# Patient Record
Sex: Male | Born: 1945 | Race: White | Hispanic: Yes | Marital: Married | Smoking: Former smoker
Health system: Southern US, Community
[De-identification: ages and names within clinical notes are randomized; demographics above are authoritative.]

## PROBLEM LIST (undated history)

## (undated) DIAGNOSIS — I2510 Atherosclerotic heart disease of native coronary artery without angina pectoris: Secondary | ICD-10-CM

## (undated) DIAGNOSIS — M069 Rheumatoid arthritis, unspecified: Secondary | ICD-10-CM

## (undated) DIAGNOSIS — Z87440 Personal history of urinary (tract) infections: Secondary | ICD-10-CM

## (undated) DIAGNOSIS — F32A Depression, unspecified: Secondary | ICD-10-CM

## (undated) DIAGNOSIS — N400 Enlarged prostate without lower urinary tract symptoms: Secondary | ICD-10-CM

## (undated) DIAGNOSIS — F4310 Post-traumatic stress disorder, unspecified: Secondary | ICD-10-CM

## (undated) DIAGNOSIS — I82409 Acute embolism and thrombosis of unspecified deep veins of unspecified lower extremity: Secondary | ICD-10-CM

## (undated) DIAGNOSIS — I10 Essential (primary) hypertension: Secondary | ICD-10-CM

## (undated) DIAGNOSIS — F329 Major depressive disorder, single episode, unspecified: Secondary | ICD-10-CM

## (undated) DIAGNOSIS — J45909 Unspecified asthma, uncomplicated: Secondary | ICD-10-CM

## (undated) DIAGNOSIS — E785 Hyperlipidemia, unspecified: Secondary | ICD-10-CM

## (undated) HISTORY — DX: Acute embolism and thrombosis of unspecified deep veins of unspecified lower extremity: I82.409

## (undated) HISTORY — PX: CORONARY STENT PLACEMENT: SHX1402

## (undated) HISTORY — DX: Post-traumatic stress disorder, unspecified: F43.10

## (undated) HISTORY — DX: Hyperlipidemia, unspecified: E78.5

## (undated) HISTORY — DX: Atherosclerotic heart disease of native coronary artery without angina pectoris: I25.10

## (undated) HISTORY — DX: Personal history of urinary (tract) infections: Z87.440

## (undated) HISTORY — DX: Benign prostatic hyperplasia without lower urinary tract symptoms: N40.0

---

## 2007-05-08 HISTORY — PX: CARDIAC SURGERY: SHX584

## 2008-01-08 ENCOUNTER — Encounter (HOSPITAL_COMMUNITY): Admission: RE | Admit: 2008-01-08 | Discharge: 2008-04-21 | Payer: Self-pay | Admitting: Cardiology

## 2012-04-21 ENCOUNTER — Emergency Department (HOSPITAL_COMMUNITY): Payer: Medicare Other

## 2012-04-21 ENCOUNTER — Encounter (HOSPITAL_COMMUNITY): Payer: Self-pay | Admitting: Emergency Medicine

## 2012-04-21 ENCOUNTER — Inpatient Hospital Stay (HOSPITAL_COMMUNITY)
Admission: EM | Admit: 2012-04-21 | Discharge: 2012-05-02 | DRG: 853 | Disposition: A | Discharge status: Skilled Nursing Facility | Payer: Medicare Other | Attending: Internal Medicine | Admitting: Internal Medicine

## 2012-04-21 DIAGNOSIS — N179 Acute kidney failure, unspecified: Secondary | ICD-10-CM | POA: Diagnosis not present

## 2012-04-21 DIAGNOSIS — G4700 Insomnia, unspecified: Secondary | ICD-10-CM | POA: Diagnosis not present

## 2012-04-21 DIAGNOSIS — N400 Enlarged prostate without lower urinary tract symptoms: Secondary | ICD-10-CM | POA: Diagnosis present

## 2012-04-21 DIAGNOSIS — A409 Streptococcal sepsis, unspecified: Principal | ICD-10-CM | POA: Diagnosis present

## 2012-04-21 DIAGNOSIS — R609 Edema, unspecified: Secondary | ICD-10-CM | POA: Diagnosis not present

## 2012-04-21 DIAGNOSIS — I824Y9 Acute embolism and thrombosis of unspecified deep veins of unspecified proximal lower extremity: Secondary | ICD-10-CM | POA: Diagnosis present

## 2012-04-21 DIAGNOSIS — I82409 Acute embolism and thrombosis of unspecified deep veins of unspecified lower extremity: Secondary | ICD-10-CM | POA: Diagnosis not present

## 2012-04-21 DIAGNOSIS — Z6841 Body Mass Index (BMI) 40.0 and over, adult: Secondary | ICD-10-CM | POA: Diagnosis not present

## 2012-04-21 DIAGNOSIS — A419 Sepsis, unspecified organism: Secondary | ICD-10-CM | POA: Diagnosis present

## 2012-04-21 DIAGNOSIS — IMO0002 Reserved for concepts with insufficient information to code with codable children: Secondary | ICD-10-CM | POA: Diagnosis not present

## 2012-04-21 DIAGNOSIS — Z7982 Long term (current) use of aspirin: Secondary | ICD-10-CM | POA: Diagnosis not present

## 2012-04-21 DIAGNOSIS — Z79899 Other long term (current) drug therapy: Secondary | ICD-10-CM | POA: Diagnosis not present

## 2012-04-21 DIAGNOSIS — E119 Type 2 diabetes mellitus without complications: Secondary | ICD-10-CM | POA: Diagnosis not present

## 2012-04-21 DIAGNOSIS — F329 Major depressive disorder, single episode, unspecified: Secondary | ICD-10-CM | POA: Diagnosis present

## 2012-04-21 DIAGNOSIS — J45909 Unspecified asthma, uncomplicated: Secondary | ICD-10-CM | POA: Diagnosis present

## 2012-04-21 DIAGNOSIS — Z9861 Coronary angioplasty status: Secondary | ICD-10-CM | POA: Diagnosis not present

## 2012-04-21 DIAGNOSIS — E871 Hypo-osmolality and hyponatremia: Secondary | ICD-10-CM | POA: Diagnosis present

## 2012-04-21 DIAGNOSIS — M009 Pyogenic arthritis, unspecified: Secondary | ICD-10-CM | POA: Diagnosis present

## 2012-04-21 DIAGNOSIS — Z452 Encounter for adjustment and management of vascular access device: Secondary | ICD-10-CM | POA: Diagnosis not present

## 2012-04-21 DIAGNOSIS — I10 Essential (primary) hypertension: Secondary | ICD-10-CM | POA: Diagnosis present

## 2012-04-21 DIAGNOSIS — N390 Urinary tract infection, site not specified: Secondary | ICD-10-CM | POA: Diagnosis present

## 2012-04-21 DIAGNOSIS — G9341 Metabolic encephalopathy: Secondary | ICD-10-CM | POA: Diagnosis not present

## 2012-04-21 DIAGNOSIS — M25569 Pain in unspecified knee: Secondary | ICD-10-CM | POA: Diagnosis not present

## 2012-04-21 DIAGNOSIS — M069 Rheumatoid arthritis, unspecified: Secondary | ICD-10-CM | POA: Diagnosis present

## 2012-04-21 DIAGNOSIS — R0602 Shortness of breath: Secondary | ICD-10-CM | POA: Diagnosis not present

## 2012-04-21 DIAGNOSIS — R5381 Other malaise: Secondary | ICD-10-CM | POA: Diagnosis not present

## 2012-04-21 DIAGNOSIS — F3289 Other specified depressive episodes: Secondary | ICD-10-CM | POA: Diagnosis present

## 2012-04-21 DIAGNOSIS — J811 Chronic pulmonary edema: Secondary | ICD-10-CM | POA: Diagnosis not present

## 2012-04-21 DIAGNOSIS — I339 Acute and subacute endocarditis, unspecified: Secondary | ICD-10-CM | POA: Diagnosis not present

## 2012-04-21 DIAGNOSIS — D72829 Elevated white blood cell count, unspecified: Secondary | ICD-10-CM | POA: Diagnosis not present

## 2012-04-21 DIAGNOSIS — F29 Unspecified psychosis not due to a substance or known physiological condition: Secondary | ICD-10-CM | POA: Diagnosis not present

## 2012-04-21 DIAGNOSIS — M79609 Pain in unspecified limb: Secondary | ICD-10-CM | POA: Diagnosis not present

## 2012-04-21 DIAGNOSIS — E785 Hyperlipidemia, unspecified: Secondary | ICD-10-CM | POA: Diagnosis not present

## 2012-04-21 DIAGNOSIS — M25469 Effusion, unspecified knee: Secondary | ICD-10-CM | POA: Diagnosis not present

## 2012-04-21 DIAGNOSIS — R6521 Severe sepsis with septic shock: Secondary | ICD-10-CM | POA: Diagnosis present

## 2012-04-21 DIAGNOSIS — Z5189 Encounter for other specified aftercare: Secondary | ICD-10-CM | POA: Diagnosis not present

## 2012-04-21 HISTORY — DX: Unspecified asthma, uncomplicated: J45.909

## 2012-04-21 HISTORY — DX: Essential (primary) hypertension: I10

## 2012-04-21 HISTORY — DX: Rheumatoid arthritis, unspecified: M06.9

## 2012-04-21 HISTORY — DX: Major depressive disorder, single episode, unspecified: F32.9

## 2012-04-21 HISTORY — DX: Depression, unspecified: F32.A

## 2012-04-21 LAB — URINALYSIS, ROUTINE W REFLEX MICROSCOPIC
Protein, ur: 100 mg/dL — AB
Urobilinogen, UA: 1 mg/dL (ref 0.0–1.0)

## 2012-04-21 LAB — BLOOD GAS, VENOUS
Drawn by: 295031
FIO2: 1 %
O2 Saturation: 82.3 %
Patient temperature: 98.6
pO2, Ven: 50 mmHg — ABNORMAL HIGH (ref 30.0–45.0)

## 2012-04-21 LAB — CBC
HCT: 37.8 % — ABNORMAL LOW (ref 39.0–52.0)
MCHC: 36 g/dL (ref 30.0–36.0)
MCV: 80.3 fL (ref 78.0–100.0)
RDW: 14.8 % (ref 11.5–15.5)
WBC: 4.9 10*3/uL (ref 4.0–10.5)

## 2012-04-21 LAB — COMPREHENSIVE METABOLIC PANEL
Albumin: 2.4 g/dL — ABNORMAL LOW (ref 3.5–5.2)
BUN: 39 mg/dL — ABNORMAL HIGH (ref 6–23)
Chloride: 90 mEq/L — ABNORMAL LOW (ref 96–112)
Creatinine, Ser: 2.84 mg/dL — ABNORMAL HIGH (ref 0.50–1.35)
GFR calc Af Amer: 25 mL/min — ABNORMAL LOW (ref >90)
Glucose, Bld: 313 mg/dL — ABNORMAL HIGH (ref 70–99)
Total Bilirubin: 0.6 mg/dL (ref 0.3–1.2)

## 2012-04-21 LAB — URINE MICROSCOPIC-ADD ON

## 2012-04-21 LAB — RAPID URINE DRUG SCREEN, HOSP PERFORMED
Amphetamines: NOT DETECTED
Barbiturates: NOT DETECTED
Tetrahydrocannabinol: NOT DETECTED

## 2012-04-21 LAB — LACTIC ACID, PLASMA: Lactic Acid, Venous: 3.1 mmol/L — ABNORMAL HIGH (ref 0.5–2.2)

## 2012-04-21 MED ORDER — SODIUM CHLORIDE 0.9 % IV BOLUS (SEPSIS)
1000.0000 mL | Freq: Once | INTRAVENOUS | Status: AC
  Administered 2012-04-21: 1000 mL via INTRAVENOUS

## 2012-04-21 MED ORDER — ACETAMINOPHEN 325 MG PO TABS
650.0000 mg | ORAL_TABLET | Freq: Once | ORAL | Status: AC
  Administered 2012-04-21: 650 mg via ORAL
  Filled 2012-04-21: qty 2

## 2012-04-21 MED ORDER — MORPHINE SULFATE 4 MG/ML IJ SOLN: 4.0000 mg | Freq: Once | INTRAMUSCULAR | Status: DC

## 2012-04-21 MED ORDER — DEXTROSE 5 % IV SOLN
1.0000 g | Freq: Once | INTRAVENOUS | Status: AC
  Administered 2012-04-21: 1 g via INTRAVENOUS
  Filled 2012-04-21: qty 10

## 2012-04-21 MED ORDER — LIDOCAINE HCL 2 % EX GEL
CUTANEOUS | Status: AC
  Administered 2012-04-21: 23:00:00
  Filled 2012-04-21: qty 10

## 2012-04-21 NOTE — ED Notes (Signed)
Dr Herma Carson called and updated on pt's condition.

## 2012-04-21 NOTE — ED Notes (Signed)
Patient refused rectal temperature MD Plunkett made aware.

## 2012-04-21 NOTE — ED Notes (Signed)
Central line placement per Dr. Anitra Lauth right IJ with no difficulty-tolerated procedure well-SPCXR completed after insertion-pt is awake and alert

## 2012-04-21 NOTE — ED Notes (Signed)
Patient refused rectal temp. EDP notified.

## 2012-04-21 NOTE — ED Notes (Signed)
Per spouse patient was in bed x5 days for gout knee pain. Upon arrival of EMS patient has difficulty breathing and foul smelling urine. Arrived to ER on CPAP and AMS.

## 2012-04-21 NOTE — ED Provider Notes (Addendum)
History     CSN: [id number]  Arrival date & time 04/21/12  1545   First MD Initiated Contact with Patient 04/21/12 1611      Chief Complaint  Patient presents with  . Altered Mental Status  . Shortness of Breath    (Consider location/radiation/quality/duration/timing/severity/associated sxs/prior treatment) HPI Comments: For the last one week patient has had a gout flare in his right knee. He has been taking oxycodone for the pain which per his wife he increased from 5 to 10 or 20 based on his pain level. Patient stopped taking his Lasix approximately 3 days ago per his PMD due to the severity of the gout. This a.m. when wife went to see the patient he was confused and talking to himself but still able to carry on a conversation with her. However he time he took pain medication was at 8 AM this morning.  She  denies any fever, cough or shortness of breath.was still able to carry on a conversation with her.   When EMS arrived and started moving him from the house to the truck is when he became short of breath.  EMS tried albuterol and Atrovent without improvement and then placed the patient on CPAP which seemed to irritate him more.  Patient is a 66 y.o. male presenting with altered mental status. The history is provided by the spouse and the EMS personnel. The history is limited by the condition of the patient.  Altered Mental Status This is a new problem. The current episode started 12 to 24 hours ago. The problem occurs constantly. The problem has been rapidly worsening. Associated symptoms include shortness of breath. Pertinent negatives include no chest pain and no abdominal pain. Nothing aggravates the symptoms. Nothing relieves the symptoms. He has tried nothing for the symptoms.    Past Medical History  Diagnosis Date  . Asthma   . Rheumatoid arthritis   . Hypertension   . Obesity     Past Surgical History  Procedure Date  . Coronary stent placement     No family history  on file.  History  Substance Use Topics  . Smoking status: Never Smoker   . Smokeless tobacco: Not on file  . Alcohol Use: No      Review of Systems  Constitutional: Negative for fever.  Respiratory: Positive for shortness of breath. Negative for cough and wheezing.   Cardiovascular: Negative for chest pain and leg swelling.  Gastrointestinal: Negative for abdominal pain.  Psychiatric/Behavioral: Positive for altered mental status.  All other systems reviewed and are negative.    Allergies  Review of patient's allergies indicates no known allergies.  Home Medications  No current outpatient prescriptions on file.  There were no vitals taken for this visit.  Physical Exam  Nursing note and vitals reviewed. Constitutional: He appears well-developed and well-nourished. He appears distressed.       Morbidly Obese male  HENT:  Head: Normocephalic and atraumatic.  Mouth/Throat: Oropharynx is clear and moist. Mucous membranes are dry.  Eyes: Conjunctivae normal and EOM are normal. Pupils are equal, round, and reactive to light.  Neck: Normal range of motion. Neck supple.  Cardiovascular: Regular rhythm and intact distal pulses.  Tachycardia present.   No murmur heard. Pulmonary/Chest: Tachypnea noted. No respiratory distress. He has decreased breath sounds. He has no wheezes. He has no rales.  Abdominal: Soft. He exhibits no distension. There is no tenderness. There is no rebound and no guarding.  Musculoskeletal: Normal range of motion. He  exhibits edema. He exhibits no tenderness.       Trace edema in lower ext.  No erythema or warmth to the right knee.  Difficult to access if swelling due to size of pt.  Neurological:       Opens his eyes to voice and can follow commands  Skin: Skin is warm and dry. No rash noted. He is not diaphoretic. No erythema.  Psychiatric: He has a normal mood and affect. His behavior is normal.    ED Course  Procedures (including critical care  time)  Labs Reviewed  CBC - Abnormal; Notable for the following:    HCT 37.8 (*)     All other components within normal limits  COMPREHENSIVE METABOLIC PANEL - Abnormal; Notable for the following:    Sodium 130 (*)     Chloride 90 (*)     Glucose, Bld 313 (*)     BUN 39 (*)     Creatinine, Ser 2.84 (*)     Albumin 2.4 (*)     AST 40 (*)     Alkaline Phosphatase 150 (*)     GFR calc non Af Amer 22 (*)     GFR calc Af Amer 25 (*)     All other components within normal limits  BLOOD GAS, VENOUS - Abnormal; Notable for the following:    pCO2, Ven 53.3 (*)     pO2, Ven 50.0 (*)     Bicarbonate 24.6 (*)     Acid-base deficit 2.3 (*)     All other components within normal limits  URINALYSIS, ROUTINE W REFLEX MICROSCOPIC - Abnormal; Notable for the following:    Color, Urine ORANGE (*)  BIOCHEMICALS MAY BE AFFECTED BY COLOR   APPearance TURBID (*)     Glucose, UA 100 (*)     Hgb urine dipstick LARGE (*)     Bilirubin Urine MODERATE (*)     Ketones, ur TRACE (*)     Protein, ur 100 (*)     Leukocytes, UA MODERATE (*)     All other components within normal limits  URINE MICROSCOPIC-ADD ON - Abnormal; Notable for the following:    Squamous Epithelial / LPF MANY (*)     Bacteria, UA MANY (*)     Casts HYALINE CASTS (*)     All other components within normal limits  LACTIC ACID, PLASMA - Abnormal; Notable for the following:    Lactic Acid, Venous 3.1 (*)     All other components within normal limits  TROPONIN I  URINE RAPID DRUG SCREEN (HOSP PERFORMED)  ACETAMINOPHEN LEVEL  URINE CULTURE   Dg Chest Port 1 View  04/21/2012  *RADIOLOGY REPORT*  Clinical Data: Shortness of breath.  Weakness.  PORTABLE CHEST - 1 VIEW  Comparison: None.  Findings: 1632 hours.  There is mild breathing artifact and detail is obscured by body habitus.  The heart is mildly enlarged.  There is vascular congestion without overt pulmonary edema.  No pleural effusion or confluent airspace opacity is present.   Telemetry leads overlie the chest.  IMPRESSION: Cardiomegaly with vascular congestion.  No overt pulmonary edema or confluent airspace opacity.   Original Report Authenticated By: Carey Bullocks, M.D.      Date: 04/21/2012  Rate: 115  Rhythm: sinus tachycardia  QRS Axis: left  Intervals: normal  ST/T Wave abnormalities: nonspecific ST/T changes  Conduction Disutrbances:none  Narrative Interpretation:   Old EKG Reviewed: unchanged  CENTRAL LINE Performed by: Gwyneth Sprout Consent: The  procedure was performed in an emergent situation. Required items: required blood products, implants, devices, and special equipment available Patient identity confirmed: arm band and provided demographic data Time out: Immediately prior to procedure a "time out" was called to verify the correct patient, procedure, equipment, support staff and site/side marked as required. Indications: vascular access Anesthesia: local infiltration Local anesthetic: lidocaine 1% with epinephrine Anesthetic total: 3 ml Patient sedated: no Preparation: skin prepped with 2% chlorhexidine Skin prep agent dried: skin prep agent completely dried prior to procedure Sterile barriers: all five maximum sterile barriers used - cap, mask, sterile gown, sterile gloves, and large sterile sheet Hand hygiene: hand hygiene performed prior to central venous catheter insertion  Location details: Right IJ   Catheter type: triple lumen Catheter size: 8 Fr Pre-procedure: landmarks identified Ultrasound guidance: Yes  Successful placement: yes Post-procedure: line sutured and dressing applied Assessment: blood return through all parts, free fluid flow, placement verified by x-ray and no pneumothorax on x-ray Patient tolerance: Patient tolerated the procedure well with no immediate complications.  CRITICAL CARE Performed by: Gwyneth Sprout   Total critical care time: 45  Critical care time was exclusive of separately billable  procedures and treating other patients.  Critical care was necessary to treat or prevent imminent or life-threatening deterioration.  Critical care was time spent personally by me on the following activities: development of treatment plan with patient and/or surrogate as well as nursing, discussions with consultants, evaluation of patient's response to treatment, examination of patient, obtaining history from patient or surrogate, ordering and performing treatments and interventions, ordering and review of laboratory studies, ordering and review of radiographic studies, pulse oximetry and re-evaluation of patient's condition.   No diagnosis found.    MDM   Patient with a significant history of arthritis and gout whose wife states approximately one week ago he developed gout in his right knee.  Patient has been taking oxycodone and this morning wife noticed him to be confused. She called EMS and when EMS arrived during transfer from the house to the ambulance patient develop shortness of breath and distress. They gave him albuterol and Atrovent without improvement and placed him on CPAP. When patient arrived he was fighting the CPAP machine and distress breathing over 30 times a minute. He does have occasional involuntary jerks. Patient will open his eyes when his name is called. Patient was taken off CPAP and placed on a nonrebreather where he became much more comfortable. Oxygenation sats are 100%. On switching CPAP to a nonrebreather patient's heart rate improved from 125-110. The patient has no signs of fluid overload the wife does state he stopped taking his Lasix on Friday due to taking the gout medication. Concern for new infection causing altered mental status versus medication overdose of narcotics versus hypercapnia as patient does have sleep apnea and has been taking narcotics. Last dose of narcotics was this morning at 8 AM. CBC, CMP, VBG, troponin, UA, acetaminophen level, UDS, CXR  pending. Unable to get an ABG some VBG done which showed some mild respiratory acidosis with a CO2 of 53 and a pH of 7.28. This could be the cause of his confusion. Oral temp 103 which is most likely the cause of his confusion.  Pt given tylenol.  Once C-pap discontinued and IV's started pt calmed down and was able to speak with his wife and appeared comfortable.   5:38 PM Chest x-ray unrevealing. CBC within normal limits. CMP showing a new acute renal insufficiency with a creatinine of  2.5. UA consistent with urinary tract infection. Patient started on Rocephin and a urine culture sent. On reevaluation he was breathing much more comfortably and able to talk in complete sentences. Will admit for hydration and treatment of urinary tract infection   7:58 PM Lactic returned at 3.1. Blood pressure has been marginal. We'll place a central line and put patient in the ICU the   Gwyneth Sprout, MD 04/21/12 1738  Gwyneth Sprout, MD 04/21/12 1958  Gwyneth Sprout, MD 04/21/12 2046

## 2012-04-21 NOTE — ED Notes (Signed)
Unsuccessfully attempted to obtain blood for labs x2.RN Silvio Pate made aware.Main lab phlebotomy will attempt

## 2012-04-21 NOTE — ED Notes (Signed)
Dr. Anitra Lauth at bedside. NRB on by RT.

## 2012-04-22 ENCOUNTER — Encounter (HOSPITAL_COMMUNITY): Payer: Self-pay | Admitting: Internal Medicine

## 2012-04-22 DIAGNOSIS — G9341 Metabolic encephalopathy: Secondary | ICD-10-CM

## 2012-04-22 DIAGNOSIS — N390 Urinary tract infection, site not specified: Secondary | ICD-10-CM | POA: Diagnosis present

## 2012-04-22 DIAGNOSIS — E871 Hypo-osmolality and hyponatremia: Secondary | ICD-10-CM

## 2012-04-22 DIAGNOSIS — N179 Acute kidney failure, unspecified: Secondary | ICD-10-CM

## 2012-04-22 DIAGNOSIS — A419 Sepsis, unspecified organism: Secondary | ICD-10-CM

## 2012-04-22 DIAGNOSIS — M25569 Pain in unspecified knee: Secondary | ICD-10-CM

## 2012-04-22 LAB — COMPREHENSIVE METABOLIC PANEL
ALT: 27 U/L (ref 0–53)
AST: 103 U/L — ABNORMAL HIGH (ref 0–37)
Albumin: 2 g/dL — ABNORMAL LOW (ref 3.5–5.2)
Alkaline Phosphatase: 124 U/L — ABNORMAL HIGH (ref 39–117)
Chloride: 98 mEq/L (ref 96–112)
Potassium: 3.9 mEq/L (ref 3.5–5.1)
Sodium: 134 mEq/L — ABNORMAL LOW (ref 135–145)
Total Bilirubin: 0.5 mg/dL (ref 0.3–1.2)
Total Protein: 5.9 g/dL — ABNORMAL LOW (ref 6.0–8.3)

## 2012-04-22 LAB — SYNOVIAL CELL COUNT + DIFF, W/ CRYSTALS
Eosinophils-Synovial: 0 % (ref 0–1)
Neutrophil, Synovial: 56 % — ABNORMAL HIGH (ref 0–25)

## 2012-04-22 LAB — GLUCOSE, CAPILLARY
Glucose-Capillary: 325 mg/dL — ABNORMAL HIGH (ref 70–99)
Glucose-Capillary: 286 mg/dL — ABNORMAL HIGH (ref 70–99)

## 2012-04-22 LAB — CBC
MCV: 88.1 fL (ref 78.0–100.0)
Platelets: 156 10*3/uL (ref 150–400)
RBC: 3.86 MIL/uL — ABNORMAL LOW (ref 4.22–5.81)
RDW: 13.8 % (ref 11.5–15.5)
WBC: 17.5 10*3/uL — ABNORMAL HIGH (ref 4.0–10.5)

## 2012-04-22 LAB — POCT I-STAT, CHEM 8
BUN: 44 mg/dL — ABNORMAL HIGH (ref 6–23)
Calcium, Ion: 1.08 mmol/L — ABNORMAL LOW (ref 1.13–1.30)
HCT: 34 % — ABNORMAL LOW (ref 39.0–52.0)
Sodium: 135 mEq/L (ref 135–145)
TCO2: 22 mmol/L (ref 0–100)

## 2012-04-22 LAB — CORTISOL: Cortisol, Plasma: 26.3 ug/dL

## 2012-04-22 LAB — TSH: TSH: 0.395 u[IU]/mL (ref 0.350–4.500)

## 2012-04-22 LAB — PROTIME-INR: Prothrombin Time: 16.6 seconds — ABNORMAL HIGH (ref 11.6–15.2)

## 2012-04-22 MED ORDER — SODIUM CHLORIDE 0.9 % IV SOLN
INTRAVENOUS | Status: AC
  Administered 2012-04-22: 100 mL/h via INTRAVENOUS
  Administered 2012-04-22: 14:00:00 via INTRAVENOUS

## 2012-04-22 MED ORDER — SODIUM CHLORIDE 0.9 % IJ SOLN
3.0000 mL | Freq: Two times a day (BID) | INTRAMUSCULAR | Status: DC
  Administered 2012-04-22 – 2012-05-02 (×10): 3 mL via INTRAVENOUS

## 2012-04-22 MED ORDER — OXYCODONE HCL 5 MG PO TABS
5.0000 mg | ORAL_TABLET | ORAL | Status: DC | PRN
  Administered 2012-04-22 – 2012-04-27 (×7): 10 mg via ORAL
  Filled 2012-04-22 (×7): qty 2

## 2012-04-22 MED ORDER — ONDANSETRON HCL 4 MG PO TABS
4.0000 mg | ORAL_TABLET | Freq: Four times a day (QID) | ORAL | Status: DC | PRN
  Administered 2012-04-28: 4 mg via ORAL
  Filled 2012-04-22: qty 1

## 2012-04-22 MED ORDER — SODIUM CHLORIDE 0.9 % IV BOLUS (SEPSIS)
1000.0000 mL | Freq: Once | INTRAVENOUS | Status: AC
  Administered 2012-04-22: 1000 mL via INTRAVENOUS

## 2012-04-22 MED ORDER — TRAZODONE HCL 150 MG PO TABS: 300.0000 mg | ORAL_TABLET | Freq: Every day | ORAL | Status: DC

## 2012-04-22 MED ORDER — HEPARIN SODIUM (PORCINE) 5000 UNIT/ML IJ SOLN
5000.0000 [IU] | Freq: Three times a day (TID) | INTRAMUSCULAR | Status: DC
  Administered 2012-04-22 – 2012-04-23 (×4): 5000 [IU] via SUBCUTANEOUS
  Filled 2012-04-22 (×7): qty 1

## 2012-04-22 MED ORDER — DEXTROSE 5 % IV SOLN
1.0000 g | INTRAVENOUS | Status: DC
  Administered 2012-04-22: 1 g via INTRAVENOUS
  Filled 2012-04-22: qty 10

## 2012-04-22 MED ORDER — NITROGLYCERIN 0.6 MG SL SUBL
0.6000 mg | SUBLINGUAL_TABLET | SUBLINGUAL | Status: DC | PRN
Start: 2012-04-22 — End: 2012-04-22

## 2012-04-22 MED ORDER — TRAZODONE HCL 150 MG PO TABS
300.0000 mg | ORAL_TABLET | Freq: Every day | ORAL | Status: DC
  Administered 2012-04-22 – 2012-05-01 (×10): 300 mg via ORAL
  Filled 2012-04-22 (×12): qty 2

## 2012-04-22 MED ORDER — NITROGLYCERIN 0.4 MG SL SUBL: 0.4000 mg | SUBLINGUAL_TABLET | SUBLINGUAL | Status: DC | PRN

## 2012-04-22 MED ORDER — ACETAMINOPHEN 650 MG RE SUPP: 650.0000 mg | Freq: Four times a day (QID) | RECTAL | Status: DC | PRN

## 2012-04-22 MED ORDER — SORBITOL 70 % SOLN
30.0000 mL | Freq: Every day | Status: DC | PRN
  Filled 2012-04-22: qty 30

## 2012-04-22 MED ORDER — ACETAMINOPHEN 325 MG PO TABS
650.0000 mg | ORAL_TABLET | Freq: Four times a day (QID) | ORAL | Status: DC | PRN
  Administered 2012-04-22 – 2012-04-24 (×2): 650 mg via ORAL
  Filled 2012-04-22 (×2): qty 2

## 2012-04-22 MED ORDER — HYDROCORTISONE SOD SUCCINATE 100 MG IJ SOLR
50.0000 mg | Freq: Three times a day (TID) | INTRAMUSCULAR | Status: AC
  Administered 2012-04-22 (×2): 50 mg via INTRAVENOUS
  Filled 2012-04-22 (×2): qty 1

## 2012-04-22 MED ORDER — MORPHINE SULFATE 2 MG/ML IJ SOLN
2.0000 mg | INTRAMUSCULAR | Status: DC | PRN
  Administered 2012-04-24 – 2012-05-01 (×24): 2 mg via INTRAVENOUS
  Administered 2012-05-02: 10:00:00 via INTRAVENOUS
  Filled 2012-04-22 (×26): qty 1

## 2012-04-22 MED ORDER — METOPROLOL TARTRATE 50 MG PO TABS
50.0000 mg | ORAL_TABLET | Freq: Every day | ORAL | Status: DC
  Administered 2012-04-22 – 2012-05-02 (×11): 50 mg via ORAL
  Filled 2012-04-22 (×11): qty 1

## 2012-04-22 MED ORDER — INSULIN ASPART 100 UNIT/ML ~~LOC~~ SOLN
0.0000 [IU] | Freq: Three times a day (TID) | SUBCUTANEOUS | Status: DC
  Administered 2012-04-22: 8 [IU] via SUBCUTANEOUS
  Administered 2012-04-22: 5 [IU] via SUBCUTANEOUS
  Administered 2012-04-22: 11 [IU] via SUBCUTANEOUS
  Administered 2012-04-23: 5 [IU] via SUBCUTANEOUS
  Administered 2012-04-23: 8 [IU] via SUBCUTANEOUS
  Administered 2012-04-23: 5 [IU] via SUBCUTANEOUS

## 2012-04-22 MED ORDER — ONDANSETRON HCL 4 MG/2ML IJ SOLN
4.0000 mg | Freq: Four times a day (QID) | INTRAMUSCULAR | Status: DC | PRN
  Administered 2012-04-24 – 2012-04-30 (×4): 4 mg via INTRAVENOUS
  Filled 2012-04-22 (×4): qty 2

## 2012-04-22 MED ORDER — LIDOCAINE HCL (PF) 1 % IJ SOLN
INTRAMUSCULAR | Status: AC
  Administered 2012-04-22: 23:00:00
  Filled 2012-04-22: qty 5

## 2012-04-22 NOTE — Progress Notes (Signed)
Transfer note: Pt transferred from [facility] tonight with septic shock likely from UTI. His BP is coming up nicely. NP to bedside. Pt says he is feeling better already. He has no specific complaints at this time.

## 2012-04-22 NOTE — Progress Notes (Signed)
Placed patient on our unit mode of auto titrate 10 min with a 3 liter bleed in.  because patient did not know his home settings.  Patient brought his home unit, but forgot to bring power cord.

## 2012-04-22 NOTE — Progress Notes (Signed)
Inpatient Diabetes Program Recommendations  AACE/ADA: New Consensus Statement on Inpatient Glycemic Control (2013)  Target Ranges:  Prepandial:   less than 140 mg/dL      Peak postprandial:   less than 180 mg/dL (1-2 hours)      Critically ill patients:  140 - 180 mg/dL   Reason for Visit: Results for BRYANT, SAYE (MRN [id number]) as of 04/22/2012 09:20  Ref. Range 04/21/2012 16:10 04/22/2012 00:48 04/22/2012 05:25  Glucose Latest Range: 70-99 mg/dL 409 (H) 811 (H) 914 (H)   No history of diabetes noted. Patient is on Solucortef 50 mg IV q 8 hours.  A1C pending.  Consider adding Lantus 20 units daily.  May also consider q 4 hour Novolog correction.  Will follow.

## 2012-04-22 NOTE — Progress Notes (Signed)
I have seen and examined pt admitted this am by Dr Robb Matar with worsening knee pain/OA, AMS, fevers, and hypotensive and found to have a UTI. He was started on empiric abx, stress dose steroids and transferred to [employer]). On exam he is awake and orientedx3, still some confusion per family at bedside. R. Knee with ballotable effusion, edematous, tender, no erythema. Knee x-ray showed efusion moderate effusion and tricompartmental degen. Changes. Per wife he has been followed by ortho at [facility] and needs knee replacement which has not been done because of his obesity. BPs are stable at this time with current management. Will continue current regimen as per Dr Robb Matar, follow cultures, cortisol level and further treat accordingly. Will also obtain uric acid level. I have called DR chandler for possible knee aspiration- awaiting call back.  Roanna Epley [facility] [phone]

## 2012-04-22 NOTE — Consult Note (Signed)
Reason for Consult:Right knee pain  Referring Physician: Dr. Coralie Carpen Lizak is an 66 y.o. male.  HPI:  Pt complains for right throbbing knee pain. States he has a history of right knee pain due to arthritis but it had gotten much worse in the last week and has been getting worse during his admission. He denies recent trauma or injury. Describes right knee as throbbing and aching. More painful with touch and movement. Per pt, history of gout and DM.    Past Medical History  Diagnosis Date  . Asthma   . Rheumatoid arthritis   . Hypertension   . Depression     Past Surgical History  Procedure Date  . Coronary stent placement     Family History  Problem Relation Age of Onset  . Hypertension Mother   . Other Father     Social History:  reports that he has never smoked. He has never used smokeless tobacco. He reports that he does not drink alcohol or use illicit drugs.  Allergies: No Known Allergies  Medications: I have reviewed the patient's current medications.  Results for orders placed during the hospital encounter of 04/21/12 (from the past 48 hour(s))  BLOOD GAS, VENOUS     Status: Abnormal   Collection Time   04/21/12  4:00 PM      Component Value Range Comment   FIO2 1.00      Delivery systems NRB MASK      pH, Ven 7.286  7.250 - 7.300    pCO2, Ven 53.3 (*) 45.0 - 50.0 mmHg    pO2, Ven 50.0 (*) 30.0 - 45.0 mmHg    Bicarbonate 24.6 (*) 20.0 - 24.0 mEq/L    TCO2 22.3  0 - 100 mmol/L    Acid-base deficit 2.3 (*) 0.0 - 2.0 mmol/L    O2 Saturation 82.3      Patient temperature 98.6      Collection site VEIN      Drawn by [phone]      Sample type VEIN     CBC     Status: Abnormal   Collection Time   04/21/12  4:10 PM      Component Value Range Comment   WBC 4.9  4.0 - 10.5 K/uL    RBC 4.71  4.22 - 5.81 MIL/uL    Hemoglobin 13.6  13.0 - 17.0 g/dL    HCT 98.1 (*) 19.1 - 52.0 %    MCV 80.3  78.0 - 100.0 fL    MCH 28.9  26.0 - 34.0 pg    MCHC 36.0  30.0 -  36.0 g/dL    RDW 47.8  29.5 - 62.1 %    Platelets 162  150 - 400 K/uL   COMPREHENSIVE METABOLIC PANEL     Status: Abnormal   Collection Time   04/21/12  4:10 PM      Component Value Range Comment   Sodium 130 (*) 135 - 145 mEq/L    Potassium 4.0  3.5 - 5.1 mEq/L    Chloride 90 (*) 96 - 112 mEq/L    CO2 23  19 - 32 mEq/L    Glucose, Bld 313 (*) 70 - 99 mg/dL    BUN 39 (*) 6 - 23 mg/dL    Creatinine, Ser 3.08 (*) 0.50 - 1.35 mg/dL    Calcium 9.0  8.4 - 65.7 mg/dL    Total Protein 7.0  6.0 - 8.3 g/dL    Albumin 2.4 (*) 3.5 -  5.2 g/dL    AST 40 (*) 0 - 37 U/L    ALT 14  0 - 53 U/L    Alkaline Phosphatase 150 (*) 39 - 117 U/L    Total Bilirubin 0.6  0.3 - 1.2 mg/dL    GFR calc non Af Amer 22 (*) >90 mL/min    GFR calc Af Amer 25 (*) >90 mL/min   TROPONIN I     Status: Normal   Collection Time   04/21/12  4:10 PM      Component Value Range Comment   Troponin I <0.30  <0.30 ng/mL   ACETAMINOPHEN LEVEL     Status: Normal   Collection Time   04/21/12  4:10 PM      Component Value Range Comment   Acetaminophen (Tylenol), Serum <15.0  10 - 30 ug/mL   URINALYSIS, ROUTINE W REFLEX MICROSCOPIC     Status: Abnormal   Collection Time   04/21/12  4:46 PM      Component Value Range Comment   Color, Urine ORANGE (*) YELLOW BIOCHEMICALS MAY BE AFFECTED BY COLOR   APPearance TURBID (*) CLEAR    Specific Gravity, Urine 1.026  1.005 - 1.030    pH 5.0  5.0 - 8.0    Glucose, UA 100 (*) NEGATIVE mg/dL    Hgb urine dipstick LARGE (*) NEGATIVE    Bilirubin Urine MODERATE (*) NEGATIVE    Ketones, ur TRACE (*) NEGATIVE mg/dL    Protein, ur 161 (*) NEGATIVE mg/dL    Urobilinogen, UA 1.0  0.0 - 1.0 mg/dL    Nitrite NEGATIVE  NEGATIVE    Leukocytes, UA MODERATE (*) NEGATIVE   URINE RAPID DRUG SCREEN (HOSP PERFORMED)     Status: Normal   Collection Time   04/21/12  4:46 PM      Component Value Range Comment   Opiates NONE DETECTED  NONE DETECTED    Cocaine NONE DETECTED  NONE DETECTED     Benzodiazepines NONE DETECTED  NONE DETECTED    Amphetamines NONE DETECTED  NONE DETECTED    Tetrahydrocannabinol NONE DETECTED  NONE DETECTED    Barbiturates NONE DETECTED  NONE DETECTED   URINE MICROSCOPIC-ADD ON     Status: Abnormal   Collection Time   04/21/12  4:46 PM      Component Value Range Comment   Squamous Epithelial / LPF MANY (*) RARE    WBC, UA 11-20  <3 WBC/hpf    RBC / HPF 11-20  <3 RBC/hpf    Bacteria, UA MANY (*) RARE    Casts HYALINE CASTS (*) NEGATIVE   LACTIC ACID, PLASMA     Status: Abnormal   Collection Time   04/21/12  7:13 PM      Component Value Range Comment   Lactic Acid, Venous 3.1 (*) 0.5 - 2.2 mmol/L   LACTIC ACID, PLASMA     Status: Abnormal   Collection Time   04/21/12 10:40 PM      Component Value Range Comment   Lactic Acid, Venous 3.4 (*) 0.5 - 2.2 mmol/L   CORTISOL     Status: Normal   Collection Time   04/21/12 10:40 PM      Component Value Range Comment   Cortisol, Plasma 74.5     POCT I-STAT, CHEM 8     Status: Abnormal   Collection Time   04/22/12 12:48 AM      Component Value Range Comment   Sodium 135  135 - 145 mEq/L  Potassium 4.0  3.5 - 5.1 mEq/L    Chloride 102  96 - 112 mEq/L    BUN 44 (*) 6 - 23 mg/dL    Creatinine, Ser 4.09 (*) 0.50 - 1.35 mg/dL    Glucose, Bld 811 (*) 70 - 99 mg/dL    Calcium, Ion 9.14 (*) 1.13 - 1.30 mmol/L    TCO2 22  0 - 100 mmol/L    Hemoglobin 11.6 (*) 13.0 - 17.0 g/dL    HCT 78.2 (*) 95.6 - 52.0 %   LACTIC ACID, PLASMA     Status: Normal   Collection Time   04/22/12  5:00 AM      Component Value Range Comment   Lactic Acid, Venous 1.9  0.5 - 2.2 mmol/L   SODIUM, URINE, RANDOM     Status: Normal   Collection Time   04/22/12  5:10 AM      Component Value Range Comment   Sodium, Ur 12     CREATININE, URINE, RANDOM     Status: Normal   Collection Time   04/22/12  5:10 AM      Component Value Range Comment   Creatinine, Urine 211.53     CBC     Status: Abnormal   Collection Time    04/22/12  5:25 AM      Component Value Range Comment   WBC 17.5 (*) 4.0 - 10.5 K/uL    RBC 3.86 (*) 4.22 - 5.81 MIL/uL    Hemoglobin 11.7 (*) 13.0 - 17.0 g/dL    HCT 21.3 (*) 08.6 - 52.0 %    MCV 88.1  78.0 - 100.0 fL    MCH 30.3  26.0 - 34.0 pg    MCHC 34.4  30.0 - 36.0 g/dL    RDW 57.8  46.9 - 62.9 %    Platelets 156  150 - 400 K/uL   TSH     Status: Normal   Collection Time   04/22/12  5:25 AM      Component Value Range Comment   TSH 0.395  0.350 - 4.500 uIU/mL   COMPREHENSIVE METABOLIC PANEL     Status: Abnormal   Collection Time   04/22/12  5:25 AM      Component Value Range Comment   Sodium 134 (*) 135 - 145 mEq/L    Potassium 3.9  3.5 - 5.1 mEq/L    Chloride 98  96 - 112 mEq/L    CO2 21  19 - 32 mEq/L    Glucose, Bld 334 (*) 70 - 99 mg/dL    BUN 48 (*) 6 - 23 mg/dL    Creatinine, Ser 5.28 (*) 0.50 - 1.35 mg/dL    Calcium 8.1 (*) 8.4 - 10.5 mg/dL    Total Protein 5.9 (*) 6.0 - 8.3 g/dL    Albumin 2.0 (*) 3.5 - 5.2 g/dL    AST 413 (*) 0 - 37 U/L    ALT 27  0 - 53 U/L    Alkaline Phosphatase 124 (*) 39 - 117 U/L    Total Bilirubin 0.5  0.3 - 1.2 mg/dL    GFR calc non Af Amer 18 (*) >90 mL/min    GFR calc Af Amer 21 (*) >90 mL/min   PROTIME-INR     Status: Abnormal   Collection Time   04/22/12  5:25 AM      Component Value Range Comment   Prothrombin Time 16.6 (*) 11.6 - 15.2 seconds    INR 1.38  0.00 - 1.49   CORTISOL     Status: Normal   Collection Time   04/22/12  5:25 AM      Component Value Range Comment   Cortisol, Plasma 26.3     HEMOGLOBIN A1C     Status: Abnormal   Collection Time   04/22/12  5:25 AM      Component Value Range Comment   Hemoglobin A1C 6.5 (*) <5.7 %    Mean Plasma Glucose 140 (*) <117 mg/dL   GLUCOSE, CAPILLARY     Status: Abnormal   Collection Time   04/22/12  7:41 AM      Component Value Range Comment   Glucose-Capillary 325 (*) 70 - 99 mg/dL    Comment 1 Notify RN     MRSA PCR SCREENING     Status: Normal   Collection Time    04/22/12  8:03 AM      Component Value Range Comment   MRSA by PCR NEGATIVE  NEGATIVE   GLUCOSE, CAPILLARY     Status: Abnormal   Collection Time   04/22/12 11:32 AM      Component Value Range Comment   Glucose-Capillary 254 (*) 70 - 99 mg/dL    Comment 1 Notify RN     GLUCOSE, CAPILLARY     Status: Abnormal   Collection Time   04/22/12  4:51 PM      Component Value Range Comment   Glucose-Capillary 227 (*) 70 - 99 mg/dL    Comment 1 Notify RN     GLUCOSE, CAPILLARY     Status: Abnormal   Collection Time   04/22/12  9:41 PM      Component Value Range Comment   Glucose-Capillary 286 (*) 70 - 99 mg/dL     Dg Chest Port 1 View  04/21/2012  *RADIOLOGY REPORT*  Clinical Data: Central line placement.  PORTABLE CHEST - 1 VIEW  Comparison: Earlier today.  Findings: Interval right jugular catheter with its tip overlying the right lung apex.  Breathing motion blurring.  Stable enlarged cardiac silhouette and grossly stable prominent pulmonary vasculature.  A small amount of patchy opacity is again demonstrated at the left lateral lung base.  Thoracic spine degenerative changes.  IMPRESSION:  1.  Right jugular catheter tip in the region of the right innominate vein. 2.  No pneumothorax. 3.  Stable cardiomegaly and pulmonary vascular congestion. 4.  Stable small amount of patchy atelectasis or pneumonia at the left lateral lung base.   Original Report Authenticated By: Beckie Salts, M.D.    Dg Chest Port 1 View  04/21/2012  *RADIOLOGY REPORT*  Clinical Data: Shortness of breath.  Weakness.  PORTABLE CHEST - 1 VIEW  Comparison: None.  Findings: 1632 hours.  There is mild breathing artifact and detail is obscured by body habitus.  The heart is mildly enlarged.  There is vascular congestion without overt pulmonary edema.  No pleural effusion or confluent airspace opacity is present.  Telemetry leads overlie the chest.  IMPRESSION: Cardiomegaly with vascular congestion.  No overt pulmonary edema or  confluent airspace opacity.   Original Report Authenticated By: Carey Bullocks, M.D.    Dg Knee Complete 4 Views Right  04/21/2012  *RADIOLOGY REPORT*  Clinical Data: Right knee pain and swelling.  RIGHT KNEE - COMPLETE 4+ VIEW  Comparison: None.  Findings: Severe narrowing of all three joint spaces.  This is most pronounced involving the medial joint space, with a bone on bone appearance.  Moderate to marked  spur formation involving all three joint compartments.  Moderate sized effusion.  No visible intra- articular loose bodies. Diffuse soft tissue edema.  IMPRESSION: Severe tricompartmental degenerative changes with a moderate sized effusion.   Original Report Authenticated By: Beckie Salts, M.D.     ROS Blood pressure 169/79, pulse 96, temperature 99 F (37.2 C), temperature source Oral, resp. rate 22, height 5' 8.5" (1.74 m), weight 157.2 kg (346 lb 9 oz), SpO2 97.00%. Physical Exam Awake, alert, unable to determine orientated to past history Right knee with lrg effusion, mild warmth and erythema Diffusely tender to palpation  Distally neurovascularly intact Compartments soft   Assessment/Plan: Right knee pain, possible gout with history but concern for infection NPO now Will follow stat aspirate, if necessary will take to OR   Procedure: Right knee was draped. The area was correctly identified and marked. Right knee was steriley prepped with alcohol swabs. 5ml of 1% lidocaine was injected into the right knee at the superiolateral portal site. The needle was removed. The site was again steriley prepped with Betadyne. A 18 G needle was then inserted into the superiolateral portal site and 80cc of purulent-appearing aspirate was removed. A bandage was applied. The patient tolerated the procedure well.   Jiles Harold 04/22/2012, 10:51 PM

## 2012-04-22 NOTE — Progress Notes (Signed)
Utilization Review Completed.Ahsha Hinsley T12/17/2013   

## 2012-04-22 NOTE — H&P (Signed)
[facility] History and Physical  Anthony Strickland JYN:[id number] DOB: 1946/03/23 DOA: 04/21/2012  Referring physician: Dr. Anitra Lauth PCP: No primary provider on file.  Specialists: none  Chief Complaint: metabolic encephalopathy   HPI: Anthony Strickland is a 66 y.o. male  With past medical history of hypertension, BPH asthma and osteoarthritis that comes in for confusion on the day of admission. As per patient and wife they relate he's been having fevers for the last 2 days. The patient has not been able to drink or eat anything except for his medications for the last 2 days. He relates his knee has been bothering him for the past week and has limited his ability to get up from bed for the last 5 days. This morning his wife relates he was so confused cannot dial a phone number. He denies any burning when he urinates, any nausea vomiting or diarrhea. His last bowel movement was 4 days ago.  Review of Systems: The patient denies anorexia, weight loss, vision loss, decreased hearing, hoarseness, chest pain, syncope, dyspnea on exertion, peripheral edema, balance deficits, hemoptysis, abdominal pain, melena, hematochezia, severe indigestion/heartburn, hematuria, incontinence, genital sores, muscle weakness, suspicious skin lesions, transient blindness, difficulty walking, depression, unusual weight change, abnormal bleeding, enlarged lymph nodes, angioedema, and breast masses.    Past Medical History  Diagnosis Date  . Asthma   . Rheumatoid arthritis   . Hypertension   . Obesity    Past Surgical History  Procedure Date  . Coronary stent placement    Social History:  reports that he has never smoked. He has never used smokeless tobacco. He reports that he does not drink alcohol or use illicit drugs. Visit home with his wife can perform most of his ADLs  No Known Allergies  Family History  Problem Relation Age of Onset  . Hypertension Mother   . Other Father     Prior to Admission  medications   Medication Sig Start Date End Date Taking? Authorizing Provider  aspirin 325 MG EC tablet Take 325 mg by mouth daily.   Yes Historical Provider, MD  benazepril (LOTENSIN) 10 MG tablet Take 10 mg by mouth daily.   Yes Historical Provider, MD  gabapentin (NEURONTIN) 100 MG capsule Take 200-400 mg by mouth 2 (two) times daily. Takes 200mg  in the morning and takes 400mg  in the evening   Yes Historical Provider, MD  metoprolol (LOPRESSOR) 50 MG tablet Take 50 mg by mouth daily.   Yes Historical Provider, MD  nitroGLYCERIN (NITROSTAT) 0.6 MG SL tablet Place 0.6 mg under the tongue every 5 (five) minutes as needed.   Yes Historical Provider, MD  oxyCODONE (OXY IR/ROXICODONE) 5 MG immediate release tablet Take 5-10 mg by mouth every 4 (four) hours as needed.   Yes Historical Provider, MD  pravastatin (PRAVACHOL) 20 MG tablet Take 20 mg by mouth daily.   Yes Historical Provider, MD  terazosin (HYTRIN) 2 MG capsule Take 2 mg by mouth at bedtime.   Yes Historical Provider, MD  trazodone (DESYREL) 300 MG tablet Take 300 mg by mouth at bedtime.   Yes Historical Provider, MD   Physical Exam: Filed Vitals:   04/21/12 2300 04/21/12 2330 04/21/12 2350 04/22/12 0010  BP: 121/62 112/62 103/68 94/53  Pulse: 94 93 91 109  Temp:  99.1 F (37.3 C) 99.3 F (37.4 C) 99.3 F (37.4 C)  TempSrc:      Resp: 21 21 19 22   SpO2: 100% 100% 99% 99%     General:  Awake alert in no acute distress nontoxic appearing, morbidly obese male  Eyes: Anicteric no pallor  ENT: Dry mucous membrane  Neck: No appreciated JVD  Cardiovascular: Rate and rhythm with positive S1 and S2 no murmurs rubs or gallops  Respiratory: Good air movement and clear to auscultation  Abdomen: Positive bowel sounds nontender nondistended and soft  Skin: No rashes or ulcerations  Musculoskeletal: Hi right knee is warm, not erythematous exquisitely tender to touch and movement  Psychiatric: Appropriate  Neurologic: Awake  alert and oriented x4 coherent for language and nonfocal  Labs on Admission:  Basic Metabolic Panel:  Lab 04/21/12 9604  NA 130*  K 4.0  CL 90*  CO2 23  GLUCOSE 313*  BUN 39*  CREATININE 2.84*  CALCIUM 9.0  MG --  PHOS --   Liver Function Tests:  Lab 04/21/12 1610  AST 40*  ALT 14  ALKPHOS 150*  BILITOT 0.6  PROT 7.0  ALBUMIN 2.4*   No results found for this basename: LIPASE:5,AMYLASE:5 in the last 168 hours No results found for this basename: AMMONIA:5 in the last 168 hours CBC:  Lab 04/21/12 1610  WBC 4.9  NEUTROABS --  HGB 13.6  HCT 37.8*  MCV 80.3  PLT 162   Cardiac Enzymes:  Lab 04/21/12 1610  CKTOTAL --  CKMB --  CKMBINDEX --  TROPONINI <0.30    BNP (last 3 results) No results found for this basename: PROBNP:3 in the last 8760 hours CBG: No results found for this basename: GLUCAP:5 in the last 168 hours  Radiological Exams on Admission: Dg Chest Port 1 View  04/21/2012  *RADIOLOGY REPORT*  Clinical Data: Central line placement.  PORTABLE CHEST - 1 VIEW  Comparison: Earlier today.  Findings: Interval right jugular catheter with its tip overlying the right lung apex.  Breathing motion blurring.  Stable enlarged cardiac silhouette and grossly stable prominent pulmonary vasculature.  A small amount of patchy opacity is again demonstrated at the left lateral lung base.  Thoracic spine degenerative changes.  IMPRESSION:  1.  Right jugular catheter tip in the region of the right innominate vein. 2.  No pneumothorax. 3.  Stable cardiomegaly and pulmonary vascular congestion. 4.  Stable small amount of patchy atelectasis or pneumonia at the left lateral lung base.   Original Report Authenticated By: Beckie Salts, M.D.    Dg Chest Port 1 View  04/21/2012  *RADIOLOGY REPORT*  Clinical Data: Shortness of breath.  Weakness.  PORTABLE CHEST - 1 VIEW  Comparison: None.  Findings: 1632 hours.  There is mild breathing artifact and detail is obscured by body habitus.   The heart is mildly enlarged.  There is vascular congestion without overt pulmonary edema.  No pleural effusion or confluent airspace opacity is present.  Telemetry leads overlie the chest.  IMPRESSION: Cardiomegaly with vascular congestion.  No overt pulmonary edema or confluent airspace opacity.   Original Report Authenticated By: Carey Bullocks, M.D.    Dg Knee Complete 4 Views Right  04/21/2012  *RADIOLOGY REPORT*  Clinical Data: Right knee pain and swelling.  RIGHT KNEE - COMPLETE 4+ VIEW  Comparison: None.  Findings: Severe narrowing of all three joint spaces.  This is most pronounced involving the medial joint space, with a bone on bone appearance.  Moderate to marked spur formation involving all three joint compartments.  Moderate sized effusion.  No visible intra- articular loose bodies. Diffuse soft tissue edema.  IMPRESSION: Severe tricompartmental degenerative changes with a moderate sized effusion.  Original Report Authenticated By: Beckie Salts, M.D.     EKG: Sinus tachycardia with left axis deviation and nonspecific T wave changes  Assessment/Plan Principal Problem: Septic shock: - Significant improvement in his blood pressure after 4 L of IV fluids. I will go ahead and given him an additional liter of normal saline, then 125 cc an hour, 24 hours. A Foley catheter was placed and the patient has only had 100 cc of urine. Admit him to the step down unit. Check CVP every 12 hours. We'll try to keep CVP closer to 10.   - This most likely secondary to urinary tract infection. He has already gotten one dose of Rocephin. His tachycardia has resolved, his blood pressure continues to improve. On admission his lactic acid was 3.4. Check a cortisone level, we'll start him on Solu-Cortef every 8 hours for 2 doses and continue if no improvement in his blood pressure or cortisone level low.  - We'll have to continue to monitor vitals  closely, if he decompensates we'll have to consult CC in for  further management. He already has a central line.   UTI (lower urinary tract infection): - Check urine cultures, his UA was compatible coronary tract infection. We'll continue Rocephin and the escalate treatment and sensitivities are back.  Acute kidney injury: - This most likely secondary to septic shock and NSAIDs use. Overhead DC NSAIDs. Continue IV fluid, which urinary sodium and urinary creatinine. Monitor strict I.'s and nose. Check a basic metabolic panel in the morning.   Metabolic encephalopathy: - Multifactorial most likely secondary to sepsis and narcotics. The patient is more awake and alert after IV fluids  Hyponatremia: - Is mostly secondary to decreased intravascular volume continue IV fluids monitor sugars and those.  Knee pain: -This most likely secondary to osteoarthritis versus gout. He's had knee pain for about a week unable to walk for the last 5 days. Any excess was only tender to palpation. He said he saw his primary care Dr. about 2 weeks ago this knee pain but over the last week has progressively gotten worse. And knee x-ray show moderate effusion with tricompartmental syndrome.  Code Status: full Family Communication: wife at bedside Disposition Plan: none (indicate anticipated LOS)  Time spent: 60 minutes  Marinda Elk [facility] Pager [phone]  If 7PM-7AM, please contact night-coverage [email or web address] Password [facility] 04/22/2012, 12:25 AM

## 2012-04-22 NOTE — Progress Notes (Signed)
Maren Reamer NP made aware of BS 334 and increase in BUN & CREAT.New orders obtained.

## 2012-04-23 ENCOUNTER — Encounter (HOSPITAL_COMMUNITY)
Admission: EM | Disposition: A | Discharge status: Home / Self Care | Payer: Self-pay | Source: Home / Self Care | Attending: Internal Medicine

## 2012-04-23 ENCOUNTER — Inpatient Hospital Stay (HOSPITAL_COMMUNITY): Payer: Medicare Other | Admitting: Anesthesiology

## 2012-04-23 ENCOUNTER — Encounter (HOSPITAL_COMMUNITY): Payer: Self-pay | Admitting: Anesthesiology

## 2012-04-23 DIAGNOSIS — M009 Pyogenic arthritis, unspecified: Secondary | ICD-10-CM

## 2012-04-23 DIAGNOSIS — M79609 Pain in unspecified limb: Secondary | ICD-10-CM

## 2012-04-23 HISTORY — PX: KNEE ARTHROSCOPY: SHX127

## 2012-04-23 LAB — GLUCOSE, CAPILLARY
Glucose-Capillary: 236 mg/dL — ABNORMAL HIGH (ref 70–99)
Glucose-Capillary: 247 mg/dL — ABNORMAL HIGH (ref 70–99)
Glucose-Capillary: 233 mg/dL — ABNORMAL HIGH (ref 70–99)

## 2012-04-23 LAB — BASIC METABOLIC PANEL
BUN: 48 mg/dL — ABNORMAL HIGH (ref 6–23)
CO2: 23 mEq/L (ref 19–32)
Calcium: 8.6 mg/dL (ref 8.4–10.5)
Chloride: 100 mEq/L (ref 96–112)
Chloride: 99 mEq/L (ref 96–112)
Creatinine, Ser: 1.82 mg/dL — ABNORMAL HIGH (ref 0.50–1.35)
Creatinine, Ser: 1.71 mg/dL — ABNORMAL HIGH (ref 0.50–1.35)
GFR calc Af Amer: 47 mL/min — ABNORMAL LOW (ref >90)
GFR calc non Af Amer: 40 mL/min — ABNORMAL LOW (ref >90)
Glucose, Bld: 279 mg/dL — ABNORMAL HIGH (ref 70–99)

## 2012-04-23 LAB — CBC WITH DIFFERENTIAL/PLATELET
Basophils Absolute: 0 10*3/uL (ref 0.0–0.1)
Eosinophils Absolute: 0 10*3/uL (ref 0.0–0.7)
HCT: 34.8 % — ABNORMAL LOW (ref 39.0–52.0)
Lymphs Abs: 1.4 10*3/uL (ref 0.7–4.0)
MCH: 30.7 pg (ref 26.0–34.0)
MCHC: 34.8 g/dL (ref 30.0–36.0)
MCV: 88.3 fL (ref 78.0–100.0)
Monocytes Relative: 11 % (ref 3–12)
Neutro Abs: 14.2 10*3/uL — ABNORMAL HIGH (ref 1.7–7.7)
RDW: 14.2 % (ref 11.5–15.5)

## 2012-04-23 LAB — URINE CULTURE: Culture: NO GROWTH

## 2012-04-23 LAB — PATHOLOGIST SMEAR REVIEW: Path Review: INCREASED

## 2012-04-23 LAB — CBC
Platelets: 166 10*3/uL (ref 150–400)
RDW: 14.2 % (ref 11.5–15.5)
WBC: 18 10*3/uL — ABNORMAL HIGH (ref 4.0–10.5)

## 2012-04-23 LAB — SURGICAL PCR SCREEN
MRSA, PCR: NEGATIVE
Staphylococcus aureus: NEGATIVE

## 2012-04-23 SURGERY — ARTHROSCOPY, KNEE
Anesthesia: General | Site: Knee | Laterality: Right | Wound class: Dirty or Infected

## 2012-04-23 MED ORDER — OXYCODONE HCL 5 MG/5ML PO SOLN: 5.0000 mg | Freq: Once | ORAL | Status: DC | PRN

## 2012-04-23 MED ORDER — ENOXAPARIN SODIUM 150 MG/ML ~~LOC~~ SOLN
1.0000 mg/kg | SUBCUTANEOUS | Status: AC
  Filled 2012-04-23: qty 2

## 2012-04-23 MED ORDER — MIDAZOLAM HCL 5 MG/5ML IJ SOLN
INTRAMUSCULAR | Status: DC | PRN
  Administered 2012-04-23: .5 mg via INTRAVENOUS

## 2012-04-23 MED ORDER — TOBRAMYCIN SULFATE 80 MG/2ML IJ SOLN
80.0000 mg | Freq: Once | INTRAVENOUS | Status: DC
  Filled 2012-04-23: qty 2

## 2012-04-23 MED ORDER — PIPERACILLIN-TAZOBACTAM 3.375 G IVPB
3.3750 g | Freq: Three times a day (TID) | INTRAVENOUS | Status: DC
  Administered 2012-04-23 – 2012-04-25 (×7): 3.375 g via INTRAVENOUS
  Filled 2012-04-23 (×8): qty 50

## 2012-04-23 MED ORDER — INSULIN ASPART 100 UNIT/ML ~~LOC~~ SOLN
SUBCUTANEOUS | Status: AC
  Filled 2012-04-23: qty 1

## 2012-04-23 MED ORDER — LIDOCAINE HCL (CARDIAC) 20 MG/ML IV SOLN
INTRAVENOUS | Status: DC | PRN
  Administered 2012-04-23: 100 mg via INTRAVENOUS

## 2012-04-23 MED ORDER — HYDROMORPHONE HCL PF 1 MG/ML IJ SOLN
0.2500 mg | INTRAMUSCULAR | Status: DC | PRN
  Administered 2012-04-23 (×2): 0.5 mg via INTRAVENOUS

## 2012-04-23 MED ORDER — SENNOSIDES-DOCUSATE SODIUM 8.6-50 MG PO TABS
2.0000 | ORAL_TABLET | Freq: Every day | ORAL | Status: DC
  Administered 2012-04-24 – 2012-05-01 (×7): 2 via ORAL
  Filled 2012-04-23 (×10): qty 2

## 2012-04-23 MED ORDER — ALBUTEROL SULFATE HFA 108 (90 BASE) MCG/ACT IN AERS
INHALATION_SPRAY | RESPIRATORY_TRACT | Status: DC | PRN
  Administered 2012-04-23: 2 via RESPIRATORY_TRACT

## 2012-04-23 MED ORDER — OXYCODONE HCL 5 MG PO TABS: 5.0000 mg | ORAL_TABLET | Freq: Once | ORAL | Status: DC | PRN

## 2012-04-23 MED ORDER — PROPOFOL 10 MG/ML IV BOLUS
INTRAVENOUS | Status: DC | PRN
  Administered 2012-04-23: 75 mg via INTRAVENOUS
  Administered 2012-04-23: 375 mg via INTRAVENOUS

## 2012-04-23 MED ORDER — FENTANYL CITRATE 0.05 MG/ML IJ SOLN: 50.0000 ug | Freq: Once | INTRAMUSCULAR | Status: DC

## 2012-04-23 MED ORDER — SODIUM CHLORIDE 0.9 % IV SOLN
INTRAVENOUS | Status: DC | PRN
  Administered 2012-04-23: 15:00:00 via INTRAVENOUS

## 2012-04-23 MED ORDER — VANCOMYCIN HCL 10 G IV SOLR
2000.0000 mg | INTRAVENOUS | Status: DC
  Administered 2012-04-23 – 2012-04-25 (×3): 2000 mg via INTRAVENOUS
  Filled 2012-04-23 (×3): qty 2000

## 2012-04-23 MED ORDER — PROMETHAZINE HCL 25 MG/ML IJ SOLN
6.2500 mg | INTRAMUSCULAR | Status: DC | PRN
  Filled 2012-04-23: qty 1

## 2012-04-23 MED ORDER — FENTANYL CITRATE 0.05 MG/ML IJ SOLN
INTRAMUSCULAR | Status: DC | PRN
  Administered 2012-04-23: 150 ug via INTRAVENOUS

## 2012-04-23 MED ORDER — ENOXAPARIN SODIUM 150 MG/ML ~~LOC~~ SOLN
1.0000 mg/kg | Freq: Two times a day (BID) | SUBCUTANEOUS | Status: DC
  Administered 2012-04-24: 160 mg via SUBCUTANEOUS
  Filled 2012-04-23 (×3): qty 2

## 2012-04-23 MED ORDER — ENOXAPARIN SODIUM 150 MG/ML ~~LOC~~ SOLN
1.0000 mg/kg | Freq: Once | SUBCUTANEOUS | Status: AC
  Administered 2012-04-24: 160 mg via SUBCUTANEOUS
  Filled 2012-04-23: qty 2

## 2012-04-23 MED ORDER — MIDAZOLAM HCL 2 MG/2ML IJ SOLN: 1.0000 mg | INTRAMUSCULAR | Status: DC | PRN

## 2012-04-23 MED ORDER — HYDROMORPHONE HCL PF 1 MG/ML IJ SOLN
INTRAMUSCULAR | Status: AC
  Filled 2012-04-23: qty 1

## 2012-04-23 MED ORDER — POLYETHYLENE GLYCOL 3350 17 G PO PACK
17.0000 g | PACK | Freq: Every day | ORAL | Status: DC | PRN
  Administered 2012-04-24: 17 g via ORAL
  Filled 2012-04-23 (×3): qty 1

## 2012-04-23 MED ORDER — SODIUM CHLORIDE 0.9 % IR SOLN
Status: DC | PRN
  Administered 2012-04-23 (×3): 3000 mL

## 2012-04-23 SURGICAL SUPPLY — 53 items
BANDAGE GAUZE ELAST BULKY 4 IN (GAUZE/BANDAGES/DRESSINGS) ×4 IMPLANT
BLADE CUDA 5.5 (BLADE) ×2 IMPLANT
BLADE GREAT WHITE 4.2 (BLADE) IMPLANT
BNDG COHESIVE 6X5 TAN STRL LF (GAUZE/BANDAGES/DRESSINGS) IMPLANT
BNDG ELASTIC 6X10 VLCR STRL LF (GAUZE/BANDAGES/DRESSINGS) ×2 IMPLANT
BUR OVAL 6.0 (BURR) IMPLANT
CHLORAPREP W/TINT 26ML (MISCELLANEOUS) ×2 IMPLANT
CLOTH BEACON ORANGE TIMEOUT ST (SAFETY) ×2 IMPLANT
COVER SURGICAL LIGHT HANDLE (MISCELLANEOUS) ×2 IMPLANT
CUFF TOURNIQUET SINGLE 34IN LL (TOURNIQUET CUFF) IMPLANT
CUFF TOURNIQUET SINGLE 44IN (TOURNIQUET CUFF) ×2 IMPLANT
DRAPE ARTHROSCOPY W/POUCH 114 (DRAPES) ×2 IMPLANT
DRAPE U-SHAPE 47X51 STRL (DRAPES) ×2 IMPLANT
DRSG EMULSION OIL 3X3 NADH (GAUZE/BANDAGES/DRESSINGS) ×2 IMPLANT
DRSG PAD ABDOMINAL 8X10 ST (GAUZE/BANDAGES/DRESSINGS) ×2 IMPLANT
EVACUATOR 3/16  PVC DRAIN (DRAIN) ×1
EVACUATOR 3/16 PVC DRAIN (DRAIN) ×1 IMPLANT
GLOVE BIO SURGEON STRL SZ 6.5 (GLOVE) ×2 IMPLANT
GLOVE BIO SURGEON STRL SZ7.5 (GLOVE) ×2 IMPLANT
GLOVE BIO SURGEON STRL SZ8.5 (GLOVE) ×2 IMPLANT
GLOVE BIOGEL PI IND STRL 7.0 (GLOVE) ×1 IMPLANT
GLOVE BIOGEL PI IND STRL 7.5 (GLOVE) ×1 IMPLANT
GLOVE BIOGEL PI IND STRL 8 (GLOVE) ×2 IMPLANT
GLOVE BIOGEL PI INDICATOR 7.0 (GLOVE) ×1
GLOVE BIOGEL PI INDICATOR 7.5 (GLOVE) ×1
GLOVE BIOGEL PI INDICATOR 8 (GLOVE) ×2
GLOVE ORTHO TXT STRL SZ7.5 (GLOVE) ×2 IMPLANT
GLOVE SURG ORTHO 7.0 STRL STRW (GLOVE) ×2 IMPLANT
GLOVE SURG SS PI 8.0 STRL IVOR (GLOVE) ×2 IMPLANT
GOWN PREVENTION PLUS LG XLONG (DISPOSABLE) ×2 IMPLANT
GOWN PREVENTION PLUS XLARGE (GOWN DISPOSABLE) IMPLANT
GOWN SRG XL XLNG 56XLVL 4 (GOWN DISPOSABLE) ×1 IMPLANT
GOWN STRL NON-REIN LRG LVL3 (GOWN DISPOSABLE) ×2 IMPLANT
GOWN STRL NON-REIN XL XLG LVL4 (GOWN DISPOSABLE) ×1
GOWN STRL REIN 2XL XLG LVL4 (GOWN DISPOSABLE) ×2 IMPLANT
HOVERMATT SINGLE USE (MISCELLANEOUS) ×2 IMPLANT
KIT BASIN OR (CUSTOM PROCEDURE TRAY) ×2 IMPLANT
KIT ROOM TURNOVER OR (KITS) ×2 IMPLANT
MANIFOLD NEPTUNE II (INSTRUMENTS) ×2 IMPLANT
NEEDLE 18GX1X1/2 (RX/OR ONLY) (NEEDLE) IMPLANT
PACK ARTHROSCOPY DSU (CUSTOM PROCEDURE TRAY) IMPLANT
PAD ARMBOARD 7.5X6 YLW CONV (MISCELLANEOUS) ×4 IMPLANT
PADDING CAST COTTON 6X4 STRL (CAST SUPPLIES) IMPLANT
SPONGE GAUZE 4X4 12PLY (GAUZE/BANDAGES/DRESSINGS) ×2 IMPLANT
SPONGE LAP 4X18 X RAY DECT (DISPOSABLE) ×2 IMPLANT
SUT ETHILON 2 0 FS 18 (SUTURE) ×2 IMPLANT
SUT ETHILON 4 0 PS 2 18 (SUTURE) ×2 IMPLANT
SYR 20CC LL (SYRINGE) IMPLANT
TOWEL OR 17X24 6PK STRL BLUE (TOWEL DISPOSABLE) ×4 IMPLANT
TUBE CONNECTING 12X1/4 (SUCTIONS) ×2 IMPLANT
TUBING ARTHROSCOPY IRRIG 16FT (MISCELLANEOUS) ×2 IMPLANT
WAND 90 DEG TURBOVAC W/CORD (SURGICAL WAND) IMPLANT
WATER STERILE IRR 1000ML POUR (IV SOLUTION) ×2 IMPLANT

## 2012-04-23 NOTE — Progress Notes (Addendum)
[facility]             Progress Note   Subjective: C/o severe  Pain in R knee  Objective: Vital signs in last 24 hours: Temp:  [97.4 F (36.3 C)-101.3 F (38.5 C)] 99.1 F (37.3 C) (12/18 0340) Pulse Rate:  [82-104] 92  (12/18 0340) Resp:  [12-22] 12  (12/18 0340) BP: (130-172)/(68-80) 138/71 mmHg (12/18 0500) SpO2:  [97 %-99 %] 97 % (12/18 0340) Weight:  [159.7 kg (352 lb 1.2 oz)] 159.7 kg (352 lb 1.2 oz) (12/17 2323) Weight change: 2.5 kg (5 lb 8.2 oz) Last BM Date: 04/18/12  Intake/Output from previous day: 12/17 0701 - 12/18 0700 In: 1930 [P.O.:460; I.V.:870; IV Piggyback:50] Out: 1200 [Urine:1200]     Physical Exam: General: Alert, awake, oriented x3, in no acute distress. HEENT: No bruits, no goiter. Heart: Regular rate and rhythm, without murmurs, rubs, gallops. Lungs: Clear to auscultation bilaterally. Abdomen: Soft, nontender, nondistended, positive bowel sounds. Extremities: R knee with swelling, tender with ROM, RLE edema 2 plus with positive pedal pulses. Neuro: Grossly intact, nonfocal.    Lab Results: Basic Metabolic Panel:  Basename 04/23/12 0500 04/22/12 0525  NA 135 134*  K 3.6 3.9  CL 100 98  CO2 23 21  GLUCOSE 279* 334*  BUN 48* 48*  CREATININE 1.82* 3.36*  CALCIUM 8.7 8.1*  MG -- --  PHOS -- --   Liver Function Tests:  [facility] 04/22/12 0525 04/21/12 1610  AST 103* 40*  ALT 27 14  ALKPHOS 124* 150*  BILITOT 0.5 0.6  PROT 5.9* 7.0  ALBUMIN 2.0* 2.4*   No results found for this basename: LIPASE:2,AMYLASE:2 in the last 72 hours No results found for this basename: AMMONIA:2 in the last 72 hours CBC:  Basename 04/23/12 0500 04/22/12 0525  WBC 17.5* 17.5*  NEUTROABS 14.2* --  HGB 12.1* 11.7*  HCT 34.8* 34.0*  MCV 88.3 88.1  PLT 152 156   Cardiac Enzymes:  Basename 04/21/12 1610  CKTOTAL --  CKMB --  CKMBINDEX --  TROPONINI <0.30   BNP: No results found for this basename: PROBNP:3 in the last 72  hours D-Dimer: No results found for this basename: DDIMER:2 in the last 72 hours CBG:  Basename 04/22/12 2141 04/22/12 1651 04/22/12 1132 04/22/12 0741  GLUCAP 286* 227* 254* 325*   Hemoglobin A1C:  Basename 04/22/12 0525  HGBA1C 6.5*   Fasting Lipid Panel: No results found for this basename: CHOL,HDL,LDLCALC,TRIG,CHOLHDL,LDLDIRECT in the last 72 hours Thyroid Function Tests:  Basename 04/22/12 0525  TSH 0.395  T4TOTAL --  FREET4 --  T3FREE --  THYROIDAB --   Anemia Panel: No results found for this basename: VITAMINB12,FOLATE,FERRITIN,TIBC,IRON,RETICCTPCT in the last 72 hours Coagulation:  Basename 04/22/12 0525  LABPROT 16.6*  INR 1.38   Urine Drug Screen: Drugs of Abuse     Component Value Date/Time   LABOPIA NONE DETECTED 04/21/2012 1646   COCAINSCRNUR NONE DETECTED 04/21/2012 1646   LABBENZ NONE DETECTED 04/21/2012 1646   AMPHETMU NONE DETECTED 04/21/2012 1646   THCU NONE DETECTED 04/21/2012 1646   LABBARB NONE DETECTED 04/21/2012 1646    Alcohol Level: No results found for this basename: ETH:2 in the last 72 hours Urinalysis:  Basename 04/21/12 1646  COLORURINE ORANGE*  LABSPEC 1.026  PHURINE 5.0  GLUCOSEU 100*  HGBUR LARGE*  BILIRUBINUR MODERATE*  KETONESUR TRACE*  PROTEINUR 100*  UROBILINOGEN 1.0  NITRITE NEGATIVE  LEUKOCYTESUR MODERATE*   Recent Results (from the past 240 hour(s))  URINE CULTURE  Status: Normal (Preliminary result)   Collection Time   04/21/12  4:46 PM      Component Value Range Status Comment   Specimen Description URINE, CLEAN CATCH   Final    Special Requests NONE   Final    Culture  Setup Time 04/21/2012 22:45   Final    Colony Count PENDING   Incomplete    Culture Culture reincubated for better growth   Final    Report Status PENDING   Incomplete   MRSA PCR SCREENING     Status: Normal   Collection Time   04/22/12  8:03 AM      Component Value Range Status Comment   MRSA by PCR NEGATIVE  NEGATIVE Final      Studies/Results: Dg Chest Port 1 View  04/21/2012  *RADIOLOGY REPORT*  Clinical Data: Central line placement.  PORTABLE CHEST - 1 VIEW  Comparison: Earlier today.  Findings: Interval right jugular catheter with its tip overlying the right lung apex.  Breathing motion blurring.  Stable enlarged cardiac silhouette and grossly stable prominent pulmonary vasculature.  A small amount of patchy opacity is again demonstrated at the left lateral lung base.  Thoracic spine degenerative changes.  IMPRESSION:  1.  Right jugular catheter tip in the region of the right innominate vein. 2.  No pneumothorax. 3.  Stable cardiomegaly and pulmonary vascular congestion. 4.  Stable small amount of patchy atelectasis or pneumonia at the left lateral lung base.   Original Report Authenticated By: Beckie Salts, M.D.    Dg Chest Port 1 View  04/21/2012  *RADIOLOGY REPORT*  Clinical Data: Shortness of breath.  Weakness.  PORTABLE CHEST - 1 VIEW  Comparison: None.  Findings: 1632 hours.  There is mild breathing artifact and detail is obscured by body habitus.  The heart is mildly enlarged.  There is vascular congestion without overt pulmonary edema.  No pleural effusion or confluent airspace opacity is present.  Telemetry leads overlie the chest.  IMPRESSION: Cardiomegaly with vascular congestion.  No overt pulmonary edema or confluent airspace opacity.   Original Report Authenticated By: Carey Bullocks, M.D.    Dg Knee Complete 4 Views Right  04/21/2012  *RADIOLOGY REPORT*  Clinical Data: Right knee pain and swelling.  RIGHT KNEE - COMPLETE 4+ VIEW  Comparison: None.  Findings: Severe narrowing of all three joint spaces.  This is most pronounced involving the medial joint space, with a bone on bone appearance.  Moderate to marked spur formation involving all three joint compartments.  Moderate sized effusion.  No visible intra- articular loose bodies. Diffuse soft tissue edema.  IMPRESSION: Severe tricompartmental  degenerative changes with a moderate sized effusion.   Original Report Authenticated By: Beckie Salts, M.D.     Medications: Scheduled Meds:   . cefTRIAXone (ROCEPHIN)  IV  1 g Intravenous Q24H  . heparin  5,000 Units Subcutaneous Q8H  . insulin aspart  0-15 Units Subcutaneous TID WC  . metoprolol  50 mg Oral Daily  . sodium chloride  3 mL Intravenous Q12H  . traZODone  300 mg Oral QHS   Continuous Infusions:  PRN Meds:.acetaminophen, acetaminophen, morphine injection, nitroGLYCERIN, ondansetron (ZOFRAN) IV, ondansetron, oxyCODONE, sorbitol  Assessment/Plan: Septic shock: resolved, BP stable for >36hours now, continue gentle IVF-  R knee septic arthritis: s/p knee aspiration yesterday, WBC 200K, change abx, add Vancomycin and Zosyn, FU synovial fluid cultures, Joint washout per Ortho  UTI (lower urinary tract infection):  - urine cultures negative and re-incubated  Acute kidney  injury:  - This most likely secondary to septic shock and NSAIDs use. Improving, continue IVF today, bmet in am .  Metabolic encephalopathy:  - Multifactorial most likely secondary to sepsis: resolved   Hyponatremia:  - Is mostly secondary to decreased intravascular volume , resolved  R leg swelling: r/o DVT  Code Status: full  Family Communication: discussed with patient at bedside  Disposition Plan: inpatient    Time spent coordinating care:   LOS: 2 days   [facility] Pager: [phone] 04/23/2012, 7:35 AM

## 2012-04-23 NOTE — Progress Notes (Signed)
Surgical pcr sent 

## 2012-04-23 NOTE — Progress Notes (Signed)
Dr. Jomarie Longs paged regarding DVT to rt. Common femoral, superficial femoral, and popliteal vein. Returned page and given orders for Lovenox per pharmacy for  DVT.

## 2012-04-23 NOTE — Progress Notes (Signed)
ANTIBIOTIC CONSULT NOTE - INITIAL  Pharmacy Consult for vanc/zosyn Indication: Sepsis/septic knee  No Known Allergies  Patient Measurements: Height: 5' 8.5" (174 cm) Weight: 352 lb 1.2 oz (159.7 kg) IBW/kg (Calculated) : 69.55   Vital Signs: Temp: 98.4 F (36.9 C) (12/18 0738) Temp src: Oral (12/18 0738) BP: 124/56 mmHg (12/18 0738) Pulse Rate: 91  (12/18 0738) Intake/Output from previous day: 12/17 0701 - 12/18 0700 In: 1930 [P.O.:460; I.V.:870; IV Piggyback:50] Out: 1200 [Urine:1200] Intake/Output from this shift:    Labs:  Basename 04/23/12 0500 04/22/12 0525 04/22/12 0510 04/22/12 0048 04/21/12 1610  WBC 17.5* 17.5* -- -- 4.9  HGB 12.1* 11.7* -- 11.6* --  PLT 152 156 -- -- 162  LABCREA -- -- 211.53 -- --  CREATININE 1.82* 3.36* -- 2.80* --   Estimated Creatinine Clearance: 60.4 ml/min (by C-G formula based on Cr of 1.82). No results found for this basename: VANCOTROUGH:2,VANCOPEAK:2,VANCORANDOM:2,GENTTROUGH:2,GENTPEAK:2,GENTRANDOM:2,TOBRATROUGH:2,TOBRAPEAK:2,TOBRARND:2,AMIKACINPEAK:2,AMIKACINTROU:2,AMIKACIN:2, in the last 72 hours   Microbiology: Recent Results (from the past 720 hour(s))  URINE CULTURE     Status: Normal (Preliminary result)   Collection Time   04/21/12  4:46 PM      Component Value Range Status Comment   Specimen Description URINE, CLEAN CATCH   Final    Special Requests NONE   Final    Culture  Setup Time 04/21/2012 22:45   Final    Colony Count PENDING   Incomplete    Culture Culture reincubated for better growth   Final    Report Status PENDING   Incomplete   MRSA PCR SCREENING     Status: Normal   Collection Time   04/22/12  8:03 AM      Component Value Range Status Comment   MRSA by PCR NEGATIVE  NEGATIVE Final     Medical History: Past Medical History  Diagnosis Date  . Asthma   . Rheumatoid arthritis   . Hypertension   . Depression     Medications:  Scheduled:    . heparin  5,000 Units Subcutaneous Q8H  . [COMPLETED]  hydrocortisone sod succinate (SOLU-CORTEF) injection  50 mg Intravenous Q8H  . insulin aspart  0-15 Units Subcutaneous TID WC  . [COMPLETED] lidocaine      . metoprolol  50 mg Oral Daily  . piperacillin-tazobactam (ZOSYN)  IV  3.375 g Intravenous Q8H  . sodium chloride  3 mL Intravenous Q12H  . traZODone  300 mg Oral QHS  . vancomycin  2,000 mg Intravenous Q24H  . [DISCONTINUED] cefTRIAXone (ROCEPHIN)  IV  1 g Intravenous Q24H   Assessment: 66 yo morbidly obese male who was admitted last night for confusion that thought to be relating to his UTI. However, he had a knee aspiration yesterday that showed turbid in appearance with >200k WBC. Therefore, he is being r/o for septic. Abx has been changed to vanc/zosyn  Goal of Therapy:  Vancomycin trough level 15-20 mcg/ml  Plan:  Vanc 2g IV q24 Zosyn 3.375g IV q8  Ulyses Southward [place] 04/23/2012,8:31 AM

## 2012-04-23 NOTE — Consult Note (Signed)
Exam/clinical picture c/o for septic knee Aspirated, await fluid results NPO in anticipation of OR Knee know decompressed, cont abx.

## 2012-04-23 NOTE — Progress Notes (Addendum)
Synovial fliud shows abundant Morse Brueggemann cells, moderate gram pos in pairs

## 2012-04-23 NOTE — Progress Notes (Signed)
Inpatient Diabetes Program Recommendations  AACE/ADA: New Consensus Statement on Inpatient Glycemic Control (2013)  Target Ranges:  Prepandial:   less than 140 mg/dL      Peak postprandial:   less than 180 mg/dL (1-2 hours)      Critically ill patients:  140 - 180 mg/dL   Reason for Visit: Results for AYO, SMOAK (MRN [id number]) as of 04/23/2012 09:33  Ref. Range 04/22/2012 07:41 04/22/2012 11:32 04/22/2012 16:51 04/22/2012 21:41 04/23/2012 07:39  Glucose-Capillary Latest Range: 70-99 mg/dL 469 (H) 629 (H) 528 (H) 286 (H) 236 (H)  Results for ADRYEL, WORTMANN (MRN [id number]) as of 04/23/2012 09:33  Ref. Range 04/22/2012 05:25  Hemoglobin A1C Latest Range: <5.7 % 6.5 (H)   No history of diabetes noted.  Note that A1C=6.5%.  Could this be new onset diabetes?  CBG's slightly better today however still greater than goal.  Steroids stopped so CBG's should continue to improve.  Will follow.

## 2012-04-23 NOTE — Anesthesia Preprocedure Evaluation (Signed)
Anesthesia Evaluation  Patient identified by MRN, date of birth, ID band Patient awake    Reviewed: Allergy & Precautions, H&P , NPO status , Patient's Chart, lab work & pertinent test results  Airway Mallampati: II TM Distance: >3 FB Neck ROM: Full    Dental   Pulmonary asthma ,  breath sounds clear to auscultation        Cardiovascular hypertension, + CAD Rhythm:Regular Rate:Normal     Neuro/Psych Depression    GI/Hepatic   Endo/Other  Morbid obesity  Renal/GU Renal InsufficiencyRenal disease     Musculoskeletal  (+) Arthritis -,   Abdominal (+) + obese,   Peds  Hematology   Anesthesia Other Findings   Reproductive/Obstetrics                           Anesthesia Physical Anesthesia Plan  ASA: III  Anesthesia Plan: General   Post-op Pain Management:    Induction: Intravenous  Airway Management Planned: LMA  Additional Equipment:   Intra-op Plan:   Post-operative Plan: Extubation in OR  Informed Consent: I have reviewed the patients History and Physical, chart, labs and discussed the procedure including the risks, benefits and alternatives for the proposed anesthesia with the patient or authorized representative who has indicated his/her understanding and acceptance.     Plan Discussed with: CRNA and Surgeon  Anesthesia Plan Comments:         Anesthesia Quick Evaluation

## 2012-04-23 NOTE — Preoperative (Signed)
Beta Blockers   Reason not to administer Beta Blockers:Not Applicable 

## 2012-04-23 NOTE — Progress Notes (Signed)
Right:  DVT noted in the common femoral, femoral, and popliteal veins.  No evidence of superficial thrombosis.  No Baker's cyst.  Left:  Negative for DVT in the common femoral vein.

## 2012-04-23 NOTE — Progress Notes (Signed)
Consent signed for surgery.

## 2012-04-23 NOTE — Transfer of Care (Signed)
Immediate Anesthesia Transfer of Care Note  Patient: Anthony Strickland  Procedure(s) Performed: Procedure(s) (LRB) with comments: ARTHROSCOPY KNEE (Right)  Patient Location: PACU  Anesthesia Type:General  Level of Consciousness: awake, alert  and oriented  Airway & Oxygen Therapy: Patient Spontanous Breathing and Patient connected to face mask oxygen  Post-op Assessment: Report given to PACU RN and Post -op Vital signs reviewed and stable  Post vital signs: Reviewed and stable  Complications: No apparent anesthesia complications

## 2012-04-23 NOTE — Op Note (Addendum)
Procedure(s): ARTHROSCOPY KNEE Procedure Note  Anthony Strickland male 66 y.o. 04/23/2012  Procedure(s) and Anesthesia Type: Arthroscopic irrigation and debridement right septic knee  Postoperative diagnosis: Right septic knee arthritis  Surgeon(s) and Role:    * Mable Paris, MD - Primary     Surgeon: Mable Paris   Assistants: Damita Lack PA-C [facility] was present and scrubbed throughout the procedure and was essential in positioning, retraction, exposure, and closure)  Anesthesia: General endotracheal anesthesia    Procedure Detail  ARTHROSCOPY KNEE  Estimated Blood Loss: Min         Drains: 2 large Hemovac drains superior medial and superior lateral   Blood Given: none         Specimens: none        Complications:  * No complications entered in OR log *         Disposition: PACU - hemodynamically stable.         Condition: stable    Procedure:   INDICATIONS FOR SURGERY: The patient is 66 y.o. male who has had about a one to two-week history of worsening right knee pain and presented to the emergency department 2 days ago septic. He was diagnosed with urinary tract infection and was treated accordingly. It was felt that his knee was likely a gout flare. I was consult in late last night to aspirate the knee was demonstrated findings consistent with septic arthritis. He was indicated for urgent treatment for irrigation debridement to eradicate the source of the infection and to try and prevent damage to the joint, which already has severe tricompartmental arthritis.  OPERATIVE FINDINGS: There was noted to be probably greater than 50 cc of copious purulent drainage upon entering the joint. There is noted to be severe tricompartmental degenerative changes with bone-on-bone in all compartments. The joint was copiously irrigated with 9 L normal saline.   DESCRIPTION OF PROCEDURE: The patient was identified in preoperative  holding area where I  personally marked the operative site after  verifying site, side, and procedure with the patient.  The patient was taken back to the operating room where general anesthesia was induced without complication and was placed in the supine position.  The right lower extremity was then prepped and  draped in a standard sterile fashion. The appropriate time-out  procedure was carried out.   A small stab incision was made in the anterolateral portal position. The arthroscope was introduced in the joint and copious purulent material came out. We milked the knee and got as much out before inserting the arthroscope as possible.. A medial portal was then established under direct visualization just above the anterior horn of the medial meniscus. the shaver was introduced through the medial portal and used to debride synovitis and to suction the fluid to allow maximal fluid flow through the joint. He was noted to have tricompartmental degenerative changes which were bone-on-bone in all compartments. There is essentially no visualized normal cartilage. Once the purulence was removed from the knee the fluid actually ran fairly clear. There is not noted to be extensive phlegmon formation and was noted to have moderate synovitis.  Total of 9 L normal saline was run through the joint and all compartments and at the conclusion of fluid was running clear. No further purulence was noted. 2 large Hemovac drains were then placed superomedial and superolateral and sewn into place. Portals were closed using 2-0 nylon. Sterile dressings were then applied and a Ace bandage was applied from the toes  to the thigh. The tourniquet was not used through the procedure.   The patient was allowed to awaken from general anesthesia transferred to the stretcher and taken to the recovery room.    POSTOPERATIVE PLAN: patient will be allowed to be weightbearing as tolerated when comfortable. We will leave the drains in for several days.  Antibiotics will be per the primary team and perhaps a infectious disease consult would be appropriate.  He also has a large DVT in the affected leg and will start treatment dose Lovenox this evening.  In the recovery room I introduced a dilute tobramycin solution into his knee through drain tube: 80 mg tobra with a total of 50 cc NS.  The drain will be clamped x 1 hour, then recharged.

## 2012-04-23 NOTE — Anesthesia Postprocedure Evaluation (Signed)
  Anesthesia Post-op Note  Patient: Anthony Strickland  Procedure(s) Performed: Procedure(s) (LRB) with comments: ARTHROSCOPY KNEE (Right)  Patient Location: PACU  Anesthesia Type:General  Level of Consciousness: awake  Airway and Oxygen Therapy: Patient Spontanous Breathing  Post-op Pain: mild  Post-op Assessment: Post-op Vital signs reviewed  Post-op Vital Signs: Reviewed  Complications: No apparent anesthesia complications

## 2012-04-23 NOTE — Progress Notes (Signed)
Dr. Ave Filter updated regarding DVT to rt. Leg and advised to hold Lovenox till after surgery.

## 2012-04-23 NOTE — Progress Notes (Signed)
ANTIBIOTIC CONSULT NOTE - INITIAL  Pharmacy Consult for vanc/zosyn Indication: Sepsis/septic knee  No Known Allergies  Patient Measurements: Height: 5' 8.5" (174 cm) Weight: 352 lb 1.2 oz (159.7 kg) IBW/kg (Calculated) : 69.55   Vital Signs: Temp: 97.9 F (36.6 C) (12/18 1156) Temp src: Oral (12/18 1156) BP: 145/62 mmHg (12/18 1156) Pulse Rate: 78  (12/18 1156) Intake/Output from previous day: 12/17 0701 - 12/18 0700 In: 1930 [P.O.:460; I.V.:870; IV Piggyback:50] Out: 1200 [Urine:1200] Intake/Output from this shift:    Labs:  Basename 04/23/12 0810 04/23/12 0500 04/22/12 0525 04/22/12 0510  WBC 18.0* 17.5* 17.5* --  HGB 11.9* 12.1* 11.7* --  PLT 166 152 156 --  LABCREA -- -- -- 211.53  CREATININE 1.71* 1.82* 3.36* --   Estimated Creatinine Clearance: 64.3 ml/min (by C-G formula based on Cr of 1.71). No results found for this basename: VANCOTROUGH:2,VANCOPEAK:2,VANCORANDOM:2,GENTTROUGH:2,GENTPEAK:2,GENTRANDOM:2,TOBRATROUGH:2,TOBRAPEAK:2,TOBRARND:2,AMIKACINPEAK:2,AMIKACINTROU:2,AMIKACIN:2, in the last 72 hours   Microbiology: Recent Results (from the past 720 hour(s))  URINE CULTURE     Status: Normal (Preliminary result)   Collection Time   04/21/12  4:46 PM      Component Value Range Status Comment   Specimen Description URINE, CLEAN CATCH   Final    Special Requests NONE   Final    Culture  Setup Time 04/21/2012 22:45   Final    Colony Count PENDING   Incomplete    Culture Culture reincubated for better growth   Final    Report Status PENDING   Incomplete   URINE CULTURE     Status: Normal   Collection Time   04/22/12  5:10 AM      Component Value Range Status Comment   Specimen Description URINE, CATHETERIZED   Final    Special Requests NONE   Final    Culture  Setup Time 04/22/2012 09:26   Final    Colony Count NO GROWTH   Final    Culture NO GROWTH   Final    Report Status 04/23/2012 FINAL   Final   MRSA PCR SCREENING     Status: Normal   Collection  Time   04/22/12  8:03 AM      Component Value Range Status Comment   MRSA by PCR NEGATIVE  NEGATIVE Final   BODY FLUID CULTURE     Status: Normal (Preliminary result)   Collection Time   04/22/12 10:52 PM      Component Value Range Status Comment   Specimen Description SYNOVIAL RIGHT KNEE   Final    Special Requests NONE   Final    Gram Stain     Final    Value: ABUNDANT WBC PRESENT, PREDOMINANTLY PMN     MODERATE GRAM POSITIVE COCCI     IN PAIRS IN CHAINS Gram Stain Report Called to,Read Back By and Verified With: Gram Stain Report Called to,Read Back By and Verified With: CAROLYN WHITE 04/23/12 10:43AM BY MILSH   Culture PENDING   Incomplete    Report Status PENDING   Incomplete     Medical History: Past Medical History  Diagnosis Date  . Asthma   . Rheumatoid arthritis   . Hypertension   . Depression     Medications:  Scheduled:     . enoxaparin (LOVENOX) injection  1 mg/kg Subcutaneous NOW  . enoxaparin (LOVENOX) injection  1 mg/kg Subcutaneous Once  . enoxaparin (LOVENOX) injection  1 mg/kg Subcutaneous Q12H  . [COMPLETED] hydrocortisone sod succinate (SOLU-CORTEF) injection  50 mg Intravenous Q8H  . insulin  aspart  0-15 Units Subcutaneous TID WC  . [COMPLETED] lidocaine      . metoprolol  50 mg Oral Daily  . piperacillin-tazobactam (ZOSYN)  IV  3.375 g Intravenous Q8H  . sodium chloride  3 mL Intravenous Q12H  . traZODone  300 mg Oral QHS  . vancomycin  2,000 mg Intravenous Q24H  . [DISCONTINUED] cefTRIAXone (ROCEPHIN)  IV  1 g Intravenous Q24H  . [DISCONTINUED] heparin  5,000 Units Subcutaneous Q8H   Assessment: 66 yo morbidly obese male who was admitted last night for confusion that thought to be relating to his UTI. However, he had a knee aspiration yesterday that showed turbid in appearance with >200k WBC. Therefore, he is being r/o for septic. Abx has been changed to vanc/zosyn  Goal of Therapy:  Vancomycin trough level 15-20 mcg/ml  Plan:  Vanc 2g IV  q24 Zosyn 3.375g IV q8  Ulyses Southward [place] 04/23/2012,12:20 PM  Addendum:  Pt now has new DVT. Full dose lovenox ordered.  Plan  Lovenox 160mg  SQ q12

## 2012-04-23 NOTE — Progress Notes (Signed)
PATIENT ID: Anthony Strickland     Procedure(s) (LRB): ARTHROSCOPY KNEE (Right)  Subjective: complains of right knee pain.  States that it has only mildly improved since the aspiration yesterday. Reports symptoms of chills.  Objective:  Filed Vitals:   04/23/12 0738  BP: 124/56  Pulse: 91  Temp: 98.4 F (36.9 C)  Resp: 11     Awake, alert, difficult to tell orientation R knee with mod swelling, very mild erythema, no warmth Diffuse right knee tenderness to palpation Can wiggles toes Intact sensation to light touch R LE Distally NVI   Labs:   [facility] 04/23/12 0500 04/22/12 0525 04/22/12 0048 04/21/12 1610  HGB 12.1* 11.7* 11.6* 13.6   Basename 04/23/12 0500 04/22/12 0525  WBC 17.5* 17.5*  RBC 3.94* 3.86*  HCT 34.8* 34.0*  PLT 152 156   Basename 04/23/12 0500 04/22/12 0525  NA 135 134*  K 3.6 3.9  CL 100 98  CO2 23 21  BUN 48* 48*  CREATININE 1.82* 3.36*  GLUCOSE 279* 334*  CALCIUM 8.7 8.1*    Assessment and Plan: Aspiration last night revealed WBC 205K with bacteria present, no crystals. Cultures pending Will take to OR and perform arthroscopic I&D today for septic knee joint NPO Antibiotics per primary team  VTE proph: per primary team

## 2012-04-24 ENCOUNTER — Encounter (HOSPITAL_COMMUNITY): Payer: Self-pay | Admitting: Orthopedic Surgery

## 2012-04-24 DIAGNOSIS — I82409 Acute embolism and thrombosis of unspecified deep veins of unspecified lower extremity: Secondary | ICD-10-CM

## 2012-04-24 LAB — CBC
Hemoglobin: 11.9 g/dL — ABNORMAL LOW (ref 13.0–17.0)
MCH: 30.4 pg (ref 26.0–34.0)
MCHC: 34.1 g/dL (ref 30.0–36.0)
MCHC: 33.9 g/dL (ref 30.0–36.0)
MCHC: 34 g/dL (ref 30.0–36.0)
MCV: 89.8 fL (ref 78.0–100.0)
MCV: 89.8 fL (ref 78.0–100.0)
Platelets: 155 10*3/uL (ref 150–400)
Platelets: 163 10*3/uL (ref 150–400)
RDW: 14.8 % (ref 11.5–15.5)
RDW: 15.1 % (ref 11.5–15.5)
WBC: 26.2 10*3/uL — ABNORMAL HIGH (ref 4.0–10.5)

## 2012-04-24 LAB — BASIC METABOLIC PANEL
Calcium: 8.7 mg/dL (ref 8.4–10.5)
GFR calc Af Amer: 63 mL/min — ABNORMAL LOW (ref >90)
GFR calc non Af Amer: 54 mL/min — ABNORMAL LOW (ref >90)
Potassium: 4.2 mEq/L (ref 3.5–5.1)
Sodium: 138 mEq/L (ref 135–145)

## 2012-04-24 LAB — URINE CULTURE: Colony Count: 35000

## 2012-04-24 LAB — GLUCOSE, CAPILLARY: Glucose-Capillary: 364 mg/dL — ABNORMAL HIGH (ref 70–99)

## 2012-04-24 MED ORDER — PROMETHAZINE HCL 25 MG/ML IJ SOLN
12.5000 mg | Freq: Four times a day (QID) | INTRAMUSCULAR | Status: DC | PRN
  Administered 2012-04-24 (×2): 12.5 mg via INTRAVENOUS
  Filled 2012-04-24: qty 1

## 2012-04-24 MED ORDER — INSULIN GLARGINE 100 UNIT/ML ~~LOC~~ SOLN
10.0000 [IU] | Freq: Every day | SUBCUTANEOUS | Status: DC
  Administered 2012-04-24: 10 [IU] via SUBCUTANEOUS

## 2012-04-24 MED ORDER — INSULIN ASPART 100 UNIT/ML ~~LOC~~ SOLN
3.0000 [IU] | Freq: Three times a day (TID) | SUBCUTANEOUS | Status: DC
  Administered 2012-04-24 – 2012-04-26 (×8): 3 [IU] via SUBCUTANEOUS
  Administered 2012-04-27: 12:00:00 via SUBCUTANEOUS
  Administered 2012-04-27 – 2012-05-02 (×8): 3 [IU] via SUBCUTANEOUS

## 2012-04-24 MED ORDER — PANTOPRAZOLE SODIUM 40 MG IV SOLR
40.0000 mg | Freq: Two times a day (BID) | INTRAVENOUS | Status: DC
  Administered 2012-04-24 – 2012-04-25 (×3): 40 mg via INTRAVENOUS
  Filled 2012-04-24 (×4): qty 40

## 2012-04-24 MED ORDER — COUMADIN BOOK
Freq: Once | Status: AC
  Administered 2012-04-24: 12:00:00
  Filled 2012-04-24: qty 1

## 2012-04-24 MED ORDER — ENOXAPARIN SODIUM 150 MG/ML ~~LOC~~ SOLN
1.0000 mg/kg | Freq: Two times a day (BID) | SUBCUTANEOUS | Status: AC
  Administered 2012-04-24 – 2012-04-28 (×9): 165 mg via SUBCUTANEOUS
  Filled 2012-04-24 (×17): qty 2

## 2012-04-24 MED ORDER — WARFARIN - PHARMACIST DOSING INPATIENT
Freq: Every day | Status: DC
  Administered 2012-04-24 – 2012-04-30 (×3)

## 2012-04-24 MED ORDER — INSULIN ASPART 100 UNIT/ML ~~LOC~~ SOLN
0.0000 [IU] | Freq: Three times a day (TID) | SUBCUTANEOUS | Status: DC
  Administered 2012-04-24 (×3): 15 [IU] via SUBCUTANEOUS
  Administered 2012-04-25 (×3): 8 [IU] via SUBCUTANEOUS
  Administered 2012-04-26 – 2012-04-27 (×4): 5 [IU] via SUBCUTANEOUS
  Administered 2012-04-27 (×2): 3 [IU] via SUBCUTANEOUS
  Administered 2012-04-28 (×2): 2 [IU] via SUBCUTANEOUS
  Administered 2012-04-28: 3 [IU] via SUBCUTANEOUS
  Administered 2012-04-29: 2 [IU] via SUBCUTANEOUS
  Administered 2012-04-29 (×2): 3 [IU] via SUBCUTANEOUS
  Administered 2012-04-30 (×2): 5 [IU] via SUBCUTANEOUS
  Administered 2012-04-30: 2 [IU] via SUBCUTANEOUS
  Administered 2012-05-01 – 2012-05-02 (×4): 3 [IU] via SUBCUTANEOUS

## 2012-04-24 MED ORDER — WARFARIN SODIUM 5 MG PO TABS
5.0000 mg | ORAL_TABLET | Freq: Once | ORAL | Status: AC
  Administered 2012-04-24: 5 mg via ORAL
  Filled 2012-04-24: qty 1

## 2012-04-24 MED ORDER — SODIUM CHLORIDE 0.9 % IJ SOLN
10.0000 mL | Freq: Two times a day (BID) | INTRAMUSCULAR | Status: DC
  Administered 2012-04-24 – 2012-04-30 (×4): 10 mL via INTRAVENOUS

## 2012-04-24 MED ORDER — WARFARIN VIDEO
Freq: Once | Status: AC
  Administered 2012-04-24: 12:00:00

## 2012-04-24 NOTE — Progress Notes (Signed)
PATIENT ID: Anthony Strickland   1 Day Post-Op Procedure(s) (LRB): ARTHROSCOPY KNEE (Right)  Subjective: Patient reports that he is feeling better. Pain in right knee has improved. Overall feels less chills and "fever". No other complaints or concerns. No numbness or tingling lower extremities.  Objective:  Filed Vitals:   04/24/12 1154  BP: 179/93  Pulse: 92  Temp: 98.4 F (36.9 C)  Resp: 30     Awake, alert, orientated R LE in ace wrap, clean, dry, intact Drain in place Right knee nontender to palpation through ACE.  Wiggles toes, intact sensation to light touch of toes. Calves nontender.  Labs:   [facility] 04/24/12 0920 04/24/12 0426 04/23/12 0810 04/23/12 0500 04/22/12 0525  HGB 11.9* 12.0* 11.9* 12.1* 11.7*   Basename 04/24/12 0920 04/24/12 0426  WBC 18.8* 21.2*  RBC 3.91* 3.97*  HCT 35.1* 35.2*  PLT 155 165   Basename 04/24/12 0426 04/23/12 0810  NA 138 134*  K 4.2 3.6  CL 103 99  CO2 24 23  BUN 40* 46*  CREATININE 1.34 1.71*  GLUCOSE 378* 272*  CALCIUM 8.7 8.6    Assessment and Plan: 1 day s/p I &D right knee for septic arthritis Knee pain has improved Draining about 180cc serosanguinous fluid so far, will keep in for at least another day Gram stain-moderate gram positive cocci, cultures pending Agree with continuing broad spectrum abx until culture is final Will continue to follow, appreciate hospitalist care  VTE proph: per primary team

## 2012-04-24 NOTE — Progress Notes (Signed)
ANTICOAGULATION CONSULT NOTE - Follow Up Consult  Pharmacy Consult for Lovenox, Coumadin Indication: DVT  No Known Allergies  Patient Measurements: Height: 5' 8.5" (174 cm) Weight: 366 lb 6.5 oz (166.2 kg) IBW/kg (Calculated) : 69.55   Vital Signs: Temp: 98.3 F (36.8 C) (12/19 0758) Temp src: Oral (12/19 0758) BP: 162/70 mmHg (12/19 0758) Pulse Rate: 68  (12/19 0758)  Labs:  Basename 04/24/12 0426 04/23/12 0810 04/23/12 0500 04/22/12 0525 04/21/12 1610  HGB 12.0* 11.9* -- -- --  HCT 35.2* 34.2* 34.8* -- --  PLT 165 166 152 -- --  APTT -- -- -- -- --  LABPROT -- -- -- 16.6* --  INR -- -- -- 1.38 --  HEPARINUNFRC -- -- -- -- --  CREATININE 1.34 1.71* 1.82* -- --  CKTOTAL -- -- -- -- --  CKMB -- -- -- -- --  TROPONINI -- -- -- -- <0.30    Estimated Creatinine Clearance: 84.1 ml/min (by C-G formula based on Cr of 1.34).   Medications:  Scheduled:    . enoxaparin (LOVENOX) injection  1 mg/kg Subcutaneous NOW  . [COMPLETED] enoxaparin (LOVENOX) injection  1 mg/kg Subcutaneous Once  . enoxaparin (LOVENOX) injection  1 mg/kg Subcutaneous Q12H  . [EXPIRED] HYDROmorphone      . [EXPIRED] insulin aspart      . insulin aspart  0-15 Units Subcutaneous TID WC  . insulin aspart  3 Units Subcutaneous TID WC  . insulin glargine  10 Units Subcutaneous QHS  . metoprolol  50 mg Oral Daily  . piperacillin-tazobactam (ZOSYN)  IV  3.375 g Intravenous Q8H  . senna-docusate  2 tablet Oral QHS  . sodium chloride  3 mL Intravenous Q12H  . traZODone  300 mg Oral QHS  . vancomycin  2,000 mg Intravenous Q24H  . [DISCONTINUED] fentaNYL  50-100 mcg Intravenous Once  . [DISCONTINUED] heparin  5,000 Units Subcutaneous Q8H  . [DISCONTINUED] insulin aspart  0-15 Units Subcutaneous TID WC  . [DISCONTINUED] tobramycin  80 mg Intravenous Once    Assessment: RLE DVT:  Lovenox started yesterday, to begin Coumadin today.  His Warfarin points score = 6 but his baseline INR was elevated at 1.4 and  he is currently on antibiotics for septic knee.  Will empirically reduce his starting dose based on these factors.  Goal of Therapy:  INR 2-3 Monitor platelets by anticoagulation protocol: Yes   Plan:  Continue Lovenox 160mg  SQ Q12h Start with Coumadin 5mg  today Daily PT/INR monitoring Order Coumadin education materials  Estella Husk, [address].D., BCPS Clinical Pharmacist  Phone [phone] Pager [phone] 04/24/2012, 9:09 AM

## 2012-04-24 NOTE — Progress Notes (Addendum)
[facility]             Progress Note   Subjective: R knee pain improved  Objective: Vital signs in last 24 hours: Temp:  [97.3 F (36.3 C)-101.1 F (38.4 C)] 100 F (37.8 C) (12/19 0339) Pulse Rate:  [74-100] 76  (12/19 0500) Resp:  [11-26] 24  (12/18 2333) BP: (102-177)/(41-85) 154/78 mmHg (12/19 0354) SpO2:  [87 %-100 %] 95 % (12/19 0354) Weight:  [166.2 kg (366 lb 6.5 oz)] 166.2 kg (366 lb 6.5 oz) (12/18 2333) Weight change: 6.5 kg (14 lb 5.3 oz) Last BM Date: 04/20/12  Intake/Output from previous day: 12/18 0701 - 12/19 0700 In: 1502 [P.O.:350; I.V.:500; IV Piggyback:652] Out: 2330 [Urine:2150; Drains:180]     Physical Exam: General: Alert, awake, oriented x3, in no acute distress. HEENT: No bruits, no goiter. Heart: Regular rate and rhythm, without murmurs, rubs, gallops. Lungs: Clear to auscultation bilaterally. Abdomen: Soft, nontender, nondistended, positive bowel sounds. Extremities: R knee with ACE dressing, RLE edema 2 plus with positive pedal pulses. Neuro: Grossly intact, nonfocal.    Lab Results: Basic Metabolic Panel:  Basename 04/24/12 0426 04/23/12 0810  NA 138 134*  K 4.2 3.6  CL 103 99  CO2 24 23  GLUCOSE 378* 272*  BUN 40* 46*  CREATININE 1.34 1.71*  CALCIUM 8.7 8.6  MG -- --  PHOS -- --   Liver Function Tests:  [facility] 04/22/12 0525 04/21/12 1610  AST 103* 40*  ALT 27 14  ALKPHOS 124* 150*  BILITOT 0.5 0.6  PROT 5.9* 7.0  ALBUMIN 2.0* 2.4*   No results found for this basename: LIPASE:2,AMYLASE:2 in the last 72 hours No results found for this basename: AMMONIA:2 in the last 72 hours CBC:  Basename 04/24/12 0426 04/23/12 0810 04/23/12 0500  WBC 21.2* 18.0* --  NEUTROABS -- -- 14.2*  HGB 12.0* 11.9* --  HCT 35.2* 34.2* --  MCV 88.7 87.5 --  PLT 165 166 --   Cardiac Enzymes:  Basename 04/21/12 1610  CKTOTAL --  CKMB --  CKMBINDEX --  TROPONINI <0.30   BNP: No results found for this basename:  PROBNP:3 in the last 72 hours D-Dimer: No results found for this basename: DDIMER:2 in the last 72 hours CBG:  Basename 04/23/12 2117 04/23/12 1724 04/23/12 1156 04/23/12 0739 04/22/12 2141 04/22/12 1651  GLUCAP 233* 258* 247* 236* 286* 227*   Hemoglobin A1C:  Basename 04/22/12 0525  HGBA1C 6.5*   Fasting Lipid Panel: No results found for this basename: CHOL,HDL,LDLCALC,TRIG,CHOLHDL,LDLDIRECT in the last 72 hours Thyroid Function Tests:  Basename 04/22/12 0525  TSH 0.395  T4TOTAL --  FREET4 --  T3FREE --  THYROIDAB --   Anemia Panel: No results found for this basename: VITAMINB12,FOLATE,FERRITIN,TIBC,IRON,RETICCTPCT in the last 72 hours Coagulation:  Basename 04/22/12 0525  LABPROT 16.6*  INR 1.38   Urine Drug Screen: Drugs of Abuse     Component Value Date/Time   LABOPIA NONE DETECTED 04/21/2012 1646   COCAINSCRNUR NONE DETECTED 04/21/2012 1646   LABBENZ NONE DETECTED 04/21/2012 1646   AMPHETMU NONE DETECTED 04/21/2012 1646   THCU NONE DETECTED 04/21/2012 1646   LABBARB NONE DETECTED 04/21/2012 1646    Alcohol Level: No results found for this basename: ETH:2 in the last 72 hours Urinalysis:  Basename 04/21/12 1646  COLORURINE ORANGE*  LABSPEC 1.026  PHURINE 5.0  GLUCOSEU 100*  HGBUR LARGE*  BILIRUBINUR MODERATE*  KETONESUR TRACE*  PROTEINUR 100*  UROBILINOGEN 1.0  NITRITE NEGATIVE  LEUKOCYTESUR MODERATE*  Recent Results (from the past 240 hour(s))  URINE CULTURE     Status: Normal   Collection Time   04/21/12  4:46 PM      Component Value Range Status Comment   Specimen Description URINE, CLEAN CATCH   Final    Special Requests NONE   Final    Culture  Setup Time 04/21/2012 22:45   Final    Colony Count 35,000 COLONIES/ML   Final    Culture     Final    Value: GROUP B STREP(S.AGALACTIAE)ISOLATED     Note: TESTING AGAINST S. AGALACTIAE NOT ROUTINELY PERFORMED DUE TO PREDICTABILITY OF AMP/PEN/VAN SUSCEPTIBILITY.   Report Status 04/24/2012 FINAL    Final   URINE CULTURE     Status: Normal   Collection Time   04/22/12  5:10 AM      Component Value Range Status Comment   Specimen Description URINE, CATHETERIZED   Final    Special Requests NONE   Final    Culture  Setup Time 04/22/2012 09:26   Final    Colony Count NO GROWTH   Final    Culture NO GROWTH   Final    Report Status 04/23/2012 FINAL   Final   MRSA PCR SCREENING     Status: Normal   Collection Time   04/22/12  8:03 AM      Component Value Range Status Comment   MRSA by PCR NEGATIVE  NEGATIVE Final   BODY FLUID CULTURE     Status: Normal (Preliminary result)   Collection Time   04/22/12 10:52 PM      Component Value Range Status Comment   Specimen Description SYNOVIAL RIGHT KNEE   Final    Special Requests NONE   Final    Gram Stain     Final    Value: ABUNDANT WBC PRESENT, PREDOMINANTLY PMN     MODERATE GRAM POSITIVE COCCI     IN PAIRS IN CHAINS Gram Stain Report Called to,Read Back By and Verified With: Gram Stain Report Called to,Read Back By and Verified With: CAROLYN WHITE 04/23/12 10:43AM BY MILSH   Culture PENDING   Incomplete    Report Status PENDING   Incomplete   SURGICAL PCR SCREEN     Status: Normal   Collection Time   04/23/12 11:09 AM      Component Value Range Status Comment   MRSA, PCR NEGATIVE  NEGATIVE Final    Staphylococcus aureus NEGATIVE  NEGATIVE Final     Studies/Results: No results found.  Medications: Scheduled Meds:    . enoxaparin (LOVENOX) injection  1 mg/kg Subcutaneous NOW  . enoxaparin (LOVENOX) injection  1 mg/kg Subcutaneous Q12H  . insulin aspart  0-15 Units Subcutaneous TID WC  . metoprolol  50 mg Oral Daily  . piperacillin-tazobactam (ZOSYN)  IV  3.375 g Intravenous Q8H  . senna-docusate  2 tablet Oral QHS  . sodium chloride  3 mL Intravenous Q12H  . traZODone  300 mg Oral QHS  . vancomycin  2,000 mg Intravenous Q24H   Continuous Infusions:  PRN Meds:.acetaminophen, acetaminophen, morphine injection,  nitroGLYCERIN, ondansetron (ZOFRAN) IV, ondansetron, oxyCODONE, polyethylene glycol, sorbitol  Assessment/Plan: Septic shock: resolved, BP stable for >36hours now, stop IVF-  R knee septic arthritis: s/p knee aspiration 12/17 WBC 200K, changed abx to Vancomycin and Zosyn 12/18, synovial fluid cultures with moderate GPC in pairs and chains, s/p  Joint irrigation and debridement per Dr.Chandler 12/19 Await final cultures and then de-escalate abx, will get ID  input.  UTI (lower urinary tract infection):  - urine cultures negative and re-incubated  Acute kidney injury:  - This most likely secondary to septic shock and NSAIDs use. Improved, stop IVF .  Metabolic encephalopathy:  - Multifactorial most likely secondary to sepsis: resolved   Hyponatremia:  - Is mostly secondary to decreased intravascular volume , resolved  R leg DVT: started lovenox yesterday, add coumadin today  DM: add lantus, novolog SSI and with meals  Code Status: full  Family Communication: discussed with patient at bedside  Disposition Plan: inpatient    Time spent coordinating care:   LOS: 3 days   [facility] Pager: [phone] 04/24/2012, 7:17 AM

## 2012-04-25 DIAGNOSIS — E119 Type 2 diabetes mellitus without complications: Secondary | ICD-10-CM | POA: Diagnosis present

## 2012-04-25 DIAGNOSIS — R6521 Severe sepsis with septic shock: Secondary | ICD-10-CM

## 2012-04-25 DIAGNOSIS — R6520 Severe sepsis without septic shock: Secondary | ICD-10-CM

## 2012-04-25 DIAGNOSIS — A419 Sepsis, unspecified organism: Secondary | ICD-10-CM

## 2012-04-25 LAB — CBC
HCT: 34.4 % — ABNORMAL LOW (ref 39.0–52.0)
Hemoglobin: 11.4 g/dL — ABNORMAL LOW (ref 13.0–17.0)
MCH: 30.1 pg (ref 26.0–34.0)
MCHC: 33.1 g/dL (ref 30.0–36.0)
MCV: 90.8 fL (ref 78.0–100.0)

## 2012-04-25 LAB — GLUCOSE, CAPILLARY

## 2012-04-25 LAB — BASIC METABOLIC PANEL
BUN: 36 mg/dL — ABNORMAL HIGH (ref 6–23)
Calcium: 8.4 mg/dL (ref 8.4–10.5)
GFR calc non Af Amer: 64 mL/min — ABNORMAL LOW (ref >90)
Glucose, Bld: 284 mg/dL — ABNORMAL HIGH (ref 70–99)

## 2012-04-25 MED ORDER — LIVING WELL WITH DIABETES BOOK
Freq: Once | Status: DC
  Filled 2012-04-25: qty 1

## 2012-04-25 MED ORDER — INSULIN GLARGINE 100 UNIT/ML ~~LOC~~ SOLN
15.0000 [IU] | Freq: Every day | SUBCUTANEOUS | Status: DC
  Administered 2012-04-25: 15 [IU] via SUBCUTANEOUS

## 2012-04-25 MED ORDER — PANTOPRAZOLE SODIUM 40 MG PO TBEC
40.0000 mg | DELAYED_RELEASE_TABLET | Freq: Two times a day (BID) | ORAL | Status: DC
  Administered 2012-04-25 – 2012-05-02 (×14): 40 mg via ORAL
  Filled 2012-04-25 (×14): qty 1

## 2012-04-25 MED ORDER — WARFARIN SODIUM 5 MG PO TABS
5.0000 mg | ORAL_TABLET | Freq: Once | ORAL | Status: AC
  Administered 2012-04-25: 5 mg via ORAL
  Filled 2012-04-25: qty 1

## 2012-04-25 MED ORDER — DEXTROSE 5 % IV SOLN
2.0000 g | INTRAVENOUS | Status: DC
  Administered 2012-04-25 – 2012-05-01 (×7): 2 g via INTRAVENOUS
  Filled 2012-04-25 (×9): qty 2

## 2012-04-25 NOTE — Consult Note (Signed)
[facility]    Date of Admission:  04/21/2012  Date of Consult:  04/25/2012  Reason for Consult:septic arthritis,  Referring Physician: Dr. Jomarie Longs   HPI: Anthony Strickland is an 66 y.o. male Hispanic male [place]- veteran with past medical history significant for rheumatoid arthritis asthma hypertension morbid obesity,  has had months and months of symptoms of right knee swelling and pain. He also had intermittent fevers which have been attributed to urinary tract infections and treated as such despite the fact that the patient has not had urinary symptoms. I suspect he has had a septic knee this entire time with intermediate bacteremia. In any case he was admitted to the hospital the 17th with severe fevers chills knee pain and found to be hypotensive and have it evidence of septic shock. Urine cultures were drawn and he was started on Rocephin and then broadened to vancomycin and Zosyn. Knee was aspirated the patient had overturned thousand white cells in his knee with bacteria seen on Gram stain. The patient underwent arthroscopic surgery on December 18 Dr. Ave Filter. Cultures from the knee and urine have both grown group B streptococci. Of note blood cultures were never drawn although he undoubtedly was bacteremic with group B strep as well. He is now clinically improved we are going to narrow him from his vancomycin and Zosyn to ceftriaxone. As mentioned he undoubtedly had streptococcal bacteremia and may have endocarditis. At minimum he needs a 2-D echocardiogram in one could consider getting a transesophageal echocardiogram although this would not change the duration of therapy in this patient which will need to be about 6 weeks of parenteral antibiotics. I spent greater than 60 minutes with the patient including greater than 50% of time in face to face counsel of the patient and in coordination of their care.     Past Medical History  Diagnosis Date  . Asthma   .  Rheumatoid arthritis   . Hypertension   . Depression     Past Surgical History  Procedure Date  . Coronary stent placement   . Knee arthroscopy 04/23/2012    Procedure: ARTHROSCOPY KNEE;  Surgeon: Mable Paris, MD;  Location: [facility] OR;  Service: Orthopedics;  Laterality: Right;  ergies:   No Known Allergies   Medications: I have reviewed patients current medications as documented in Epic Anti-infectives     Start     Dose/Rate Route Frequency Ordered Stop   04/25/12 1200   cefTRIAXone (ROCEPHIN) 2 g in dextrose 5 % 50 mL IVPB        2 g 100 mL/hr over 30 Minutes Intravenous Every 24 hours 04/25/12 1122     04/23/12 1645   tobramycin (NEBCIN) 80 mg in sodium chloride 0.9 % 50 mL IVPB  Status:  Discontinued        80 mg 104 mL/hr over 30 Minutes Intravenous  Once 04/23/12 1634 04/23/12 1825   04/23/12 0930   vancomycin (VANCOCIN) 2,000 mg in sodium chloride 0.9 % 500 mL IVPB  Status:  Discontinued        2,000 mg 250 mL/hr over 120 Minutes Intravenous Every 24 hours 04/23/12 0831 04/25/12 1122   04/23/12 0900   piperacillin-tazobactam (ZOSYN) IVPB 3.375 g  Status:  Discontinued        3.375 g 12.5 mL/hr over 240 Minutes Intravenous Every 8 hours 04/23/12 0830 04/25/12 1122   04/22/12 1800   cefTRIAXone (ROCEPHIN) 1 g in dextrose 5 % 50 mL IVPB  Status:  Discontinued        1 g 100 mL/hr over 30 Minutes Intravenous Every 24 hours 04/22/12 0422 04/23/12 0737   04/21/12 1730   cefTRIAXone (ROCEPHIN) 1 g in dextrose 5 % 50 mL IVPB        1 g 100 mL/hr over 30 Minutes Intravenous  Once 04/21/12 1727 04/21/12 1846          Social History:  reports that he has never smoked. He has never used smokeless tobacco. He reports that he does not drink alcohol or use illicit drugs.  Family History  Problem Relation Age of Onset  . Hypertension Mother   . Other Father     As in HPI and primary teams notes otherwise 12 point review of systems is negative  Blood pressure  147/58, pulse 67, temperature 97.9 F (36.6 C), temperature source Oral, resp. rate 19, height 5' 8.5" (1.74 m), weight 353 lb 6.4 oz (160.3 kg), SpO2 99.00%. General: Alert and awake, oriented x3, not in any acute distress. Morbidly obese  HEENT: anicteric sclera, pupils reactive to light and accommodation, EOMI, oropharynx clear and without exudate CVS regular rate, normal r,  no murmur rubs or gallops Chest: clear to auscultation bilaterally, no wheezing, rales or rhonchi Abdomen: soft complaining of tenderness in his epigastrium nondistended, normal bowel sounds, Extremities: Right knee wrapped in dressing  Skin: no rashes Neuro: nonfocal, strength and sensation intact   Results for orders placed during the hospital encounter of 04/21/12 (from the past 48 hour(s))  GLUCOSE, CAPILLARY     Status: Abnormal   Collection Time   04/23/12  5:24 PM      Component Value Range Comment   Glucose-Capillary 258 (*) 70 - 99 mg/dL   GLUCOSE, CAPILLARY     Status: Abnormal   Collection Time   04/23/12  9:17 PM      Component Value Range Comment   Glucose-Capillary 233 (*) 70 - 99 mg/dL   CBC     Status: Abnormal   Collection Time   04/24/12  4:26 AM      Component Value Range Comment   WBC 21.2 (*) 4.0 - 10.5 K/uL    RBC 3.97 (*) 4.22 - 5.81 MIL/uL    Hemoglobin 12.0 (*) 13.0 - 17.0 g/dL    HCT 10.2 (*) 72.5 - 52.0 %    MCV 88.7  78.0 - 100.0 fL    MCH 30.2  26.0 - 34.0 pg    MCHC 34.1  30.0 - 36.0 g/dL    RDW 36.6  44.0 - 34.7 %    Platelets 165  150 - 400 K/uL   BASIC METABOLIC PANEL     Status: Abnormal   Collection Time   04/24/12  4:26 AM      Component Value Range Comment   Sodium 138  135 - 145 mEq/L    Potassium 4.2  3.5 - 5.1 mEq/L    Chloride 103  96 - 112 mEq/L    CO2 24  19 - 32 mEq/L    Glucose, Bld 378 (*) 70 - 99 mg/dL    BUN 40 (*) 6 - 23 mg/dL    Creatinine, Ser 4.25  0.50 - 1.35 mg/dL    Calcium 8.7  8.4 - 95.6 mg/dL    GFR calc non Af Amer 54 (*) >90 mL/min     GFR calc Af Amer 63 (*) >90 mL/min   GLUCOSE, CAPILLARY     Status: Abnormal  Collection Time   04/24/12  7:56 AM      Component Value Range Comment   Glucose-Capillary 364 (*) 70 - 99 mg/dL    Comment 1 Notify RN     CBC     Status: Abnormal   Collection Time   04/24/12  9:20 AM      Component Value Range Comment   WBC 18.8 (*) 4.0 - 10.5 K/uL    RBC 3.91 (*) 4.22 - 5.81 MIL/uL    Hemoglobin 11.9 (*) 13.0 - 17.0 g/dL    HCT 82.9 (*) 56.2 - 52.0 %    MCV 89.8  78.0 - 100.0 fL    MCH 30.4  26.0 - 34.0 pg    MCHC 33.9  30.0 - 36.0 g/dL    RDW 13.0  86.5 - 78.4 %    Platelets 155  150 - 400 K/uL   GLUCOSE, CAPILLARY     Status: Abnormal   Collection Time   04/24/12 11:52 AM      Component Value Range Comment   Glucose-Capillary 403 (*) 70 - 99 mg/dL   GLUCOSE, CAPILLARY     Status: Abnormal   Collection Time   04/24/12  1:25 PM      Component Value Range Comment   Glucose-Capillary 375 (*) 70 - 99 mg/dL   GLUCOSE, CAPILLARY     Status: Abnormal   Collection Time   04/24/12  3:51 PM      Component Value Range Comment   Glucose-Capillary 366 (*) 70 - 99 mg/dL    Comment 1 Notify RN     GLUCOSE, CAPILLARY     Status: Abnormal   Collection Time   04/24/12  8:58 PM      Component Value Range Comment   Glucose-Capillary 253 (*) 70 - 99 mg/dL    Comment 1 Notify RN     CBC     Status: Abnormal   Collection Time   04/24/12  9:27 PM      Component Value Range Comment   WBC 26.2 (*) 4.0 - 10.5 K/uL    RBC 3.73 (*) 4.22 - 5.81 MIL/uL    Hemoglobin 11.4 (*) 13.0 - 17.0 g/dL    HCT 69.6 (*) 29.5 - 52.0 %    MCV 89.8  78.0 - 100.0 fL    MCH 30.6  26.0 - 34.0 pg    MCHC 34.0  30.0 - 36.0 g/dL    RDW 28.4  13.2 - 44.0 %    Platelets 163  150 - 400 K/uL   CBC     Status: Abnormal   Collection Time   04/25/12  5:00 AM      Component Value Range Comment   WBC 25.3 (*) 4.0 - 10.5 K/uL    RBC 3.79 (*) 4.22 - 5.81 MIL/uL    Hemoglobin 11.4 (*) 13.0 - 17.0 g/dL    HCT 10.2 (*)  72.5 - 52.0 %    MCV 90.8  78.0 - 100.0 fL    MCH 30.1  26.0 - 34.0 pg    MCHC 33.1  30.0 - 36.0 g/dL    RDW 36.6  44.0 - 34.7 %    Platelets 173  150 - 400 K/uL   BASIC METABOLIC PANEL     Status: Abnormal   Collection Time   04/25/12  5:00 AM      Component Value Range Comment   Sodium 143  135 - 145 mEq/L    Potassium 4.2  3.5 - 5.1 mEq/L    Chloride 109  96 - 112 mEq/L    CO2 27  19 - 32 mEq/L    Glucose, Bld 284 (*) 70 - 99 mg/dL    BUN 36 (*) 6 - 23 mg/dL    Creatinine, Ser 1.61  0.50 - 1.35 mg/dL    Calcium 8.4  8.4 - 09.6 mg/dL    GFR calc non Af Amer 64 (*) >90 mL/min    GFR calc Af Amer 74 (*) >90 mL/min   PROTIME-INR     Status: Abnormal   Collection Time   04/25/12  5:00 AM      Component Value Range Comment   Prothrombin Time 16.1 (*) 11.6 - 15.2 seconds    INR 1.32  0.00 - 1.49   GLUCOSE, CAPILLARY     Status: Abnormal   Collection Time   04/25/12  8:30 AM      Component Value Range Comment   Glucose-Capillary 281 (*) 70 - 99 mg/dL       Component Value Date/Time   SDES SYNOVIAL RIGHT KNEE 04/22/2012 2252   SPECREQUEST NONE 04/22/2012 2252   CULT  Value: ABUNDANT GROUP B STREP(S.AGALACTIAE)ISOLATED Note: TESTING AGAINST S. AGALACTIAE NOT ROUTINELY PERFORMED DUE TO PREDICTABILITY OF AMP/PEN/VAN SUSCEPTIBILITY. 04/22/2012 2252   REPTSTATUS 04/24/2012 FINAL 04/22/2012 2252   No results found.   Recent Results (from the past 720 hour(s))  URINE CULTURE     Status: Normal   Collection Time   04/21/12  4:46 PM      Component Value Range Status Comment   Specimen Description URINE, CLEAN CATCH   Final    Special Requests NONE   Final    Culture  Setup Time 04/21/2012 22:45   Final    Colony Count 35,000 COLONIES/ML   Final    Culture     Final    Value: GROUP B STREP(S.AGALACTIAE)ISOLATED     Note: TESTING AGAINST S. AGALACTIAE NOT ROUTINELY PERFORMED DUE TO PREDICTABILITY OF AMP/PEN/VAN SUSCEPTIBILITY.   Report Status 04/24/2012 FINAL   Final   URINE  CULTURE     Status: Normal   Collection Time   04/22/12  5:10 AM      Component Value Range Status Comment   Specimen Description URINE, CATHETERIZED   Final    Special Requests NONE   Final    Culture  Setup Time 04/22/2012 09:26   Final    Colony Count NO GROWTH   Final    Culture NO GROWTH   Final    Report Status 04/23/2012 FINAL   Final   MRSA PCR SCREENING     Status: Normal   Collection Time   04/22/12  8:03 AM      Component Value Range Status Comment   MRSA by PCR NEGATIVE  NEGATIVE Final   BODY FLUID CULTURE     Status: Normal   Collection Time   04/22/12 10:52 PM      Component Value Range Status Comment   Specimen Description SYNOVIAL RIGHT KNEE   Final    Special Requests NONE   Final    Gram Stain     Final    Value: ABUNDANT WBC PRESENT, PREDOMINANTLY PMN     MODERATE GRAM POSITIVE COCCI     IN PAIRS IN CHAINS Gram Stain Report Called to,Read Back By and Verified With: Gram Stain Report Called to,Read Back By and Verified With: CAROLYN WHITE 04/23/12 10:43AM BY MILSH   Culture  Final    Value: ABUNDANT GROUP B STREP(S.AGALACTIAE)ISOLATED     Note: TESTING AGAINST S. AGALACTIAE NOT ROUTINELY PERFORMED DUE TO PREDICTABILITY OF AMP/PEN/VAN SUSCEPTIBILITY.   Report Status 04/24/2012 FINAL   Final   SURGICAL PCR SCREEN     Status: Normal   Collection Time   04/23/12 11:09 AM      Component Value Range Status Comment   MRSA, PCR NEGATIVE  NEGATIVE Final    Staphylococcus aureus NEGATIVE  NEGATIVE Final      Impression/Recommendation  66 year old Hispanic gentleman with group B strep Epic arthritis and highly likely concommittant GBS bacteremia and septic shock  #1 GBS Septic arthritis, likely bacteremia --would DC central line and place peripheral --check surveillance cultures AFTER central line out  to make sure no GBS in blood now --He will need 6 weeks of IV rocephin 2grams IV daily with day #1 being first day without central line and with negative blood  culture  --he should have weekly cbc, bmp faxed to my office at [phone] --I will order ESR and CRP now --I would not put in PICC line until we have @ least 2 days of negative culture data from blood drawn after central line removed --check 2 d echo and one could consider TEE but this will nto alter duration in this gentleman  #2 screening; check HIV and HEp serologies  #3 abdominal pain: will check plain films per wifes request with concern about ileus otherwise will defer to primary team.    Thank you so much for this interesting consult  [facility] [phone] (pager) [phone] (office) 04/25/2012, 12:38 PM  Anthony Strickland [place] 04/25/2012, 12:38 PM

## 2012-04-25 NOTE — Progress Notes (Signed)
Patient has home CPAP unit. ?

## 2012-04-25 NOTE — Progress Notes (Signed)
[facility]             Progress Note   Subjective: Feels weak, but ok otherwise, R knee pain improved  Objective: Vital signs in last 24 hours: Temp:  [97.9 F (36.6 C)-99.7 F (37.6 C)] 97.9 F (36.6 C) (12/20 0836) Pulse Rate:  [59-92] 67  (12/20 0900) Resp:  [19-30] 19  (12/20 0836) BP: (145-179)/(58-93) 147/58 mmHg (12/20 0900) SpO2:  [96 %-100 %] 99 % (12/20 0836) Weight:  [160.3 kg (353 lb 6.4 oz)] 160.3 kg (353 lb 6.4 oz) (12/19 2337) Weight change: -5.9 kg (-13 lb 0.1 oz) Last BM Date: 04/25/12  Intake/Output from previous day: 12/19 0701 - 12/20 0700 In: 1730 [P.O.:1080; IV Piggyback:650] Out: 2162 [Urine:2050; Drains:110; Stool:2] Total I/O In: -  Out: 251 [Urine:250; Stool:1]   Physical Exam: General: Alert, awake, oriented x3, in no acute distress. HEENT: No bruits, no goiter. Heart: Regular rate and rhythm, without murmurs, rubs, gallops. Lungs: Clear to auscultation bilaterally. Abdomen: Soft, nontender, nondistended, positive bowel sounds. Extremities: R knee with ACE dressing, RLE edema 2 plus with positive pedal pulses. Neuro: Grossly intact, nonfocal.    Lab Results: Basic Metabolic Panel:  Basename 04/25/12 0500 04/24/12 0426  NA 143 138  K 4.2 4.2  CL 109 103  CO2 27 24  GLUCOSE 284* 378*  BUN 36* 40*  CREATININE 1.17 1.34  CALCIUM 8.4 8.7  MG -- --  PHOS -- --   Liver Function Tests: No results found for this basename: AST:2,ALT:2,ALKPHOS:2,BILITOT:2,PROT:2,ALBUMIN:2 in the last 72 hours No results found for this basename: LIPASE:2,AMYLASE:2 in the last 72 hours No results found for this basename: AMMONIA:2 in the last 72 hours CBC:  Basename 04/25/12 0500 04/24/12 2127 04/23/12 0500  WBC 25.3* 26.2* --  NEUTROABS -- -- 14.2*  HGB 11.4* 11.4* --  HCT 34.4* 33.5* --  MCV 90.8 89.8 --  PLT 173 163 --   Cardiac Enzymes: No results found for this basename: CKTOTAL:3,CKMB:3,CKMBINDEX:3,TROPONINI:3 in the last 72  hours BNP: No results found for this basename: PROBNP:3 in the last 72 hours D-Dimer: No results found for this basename: DDIMER:2 in the last 72 hours CBG:  Basename 04/25/12 0830 04/24/12 2058 04/24/12 1551 04/24/12 1325 04/24/12 1152 04/24/12 0756  GLUCAP 281* 253* 366* 375* 403* 364*   Hemoglobin A1C: No results found for this basename: HGBA1C in the last 72 hours Fasting Lipid Panel: No results found for this basename: CHOL,HDL,LDLCALC,TRIG,CHOLHDL,LDLDIRECT in the last 72 hours Thyroid Function Tests: No results found for this basename: TSH,T4TOTAL,FREET4,T3FREE,THYROIDAB in the last 72 hours Anemia Panel: No results found for this basename: VITAMINB12,FOLATE,FERRITIN,TIBC,IRON,RETICCTPCT in the last 72 hours Coagulation:  Basename 04/25/12 0500  LABPROT 16.1*  INR 1.32   Urine Drug Screen: Drugs of Abuse     Component Value Date/Time   LABOPIA NONE DETECTED 04/21/2012 1646   COCAINSCRNUR NONE DETECTED 04/21/2012 1646   LABBENZ NONE DETECTED 04/21/2012 1646   AMPHETMU NONE DETECTED 04/21/2012 1646   THCU NONE DETECTED 04/21/2012 1646   LABBARB NONE DETECTED 04/21/2012 1646    Alcohol Level: No results found for this basename: ETH:2 in the last 72 hours Urinalysis: No results found for this basename: COLORURINE:2,APPERANCEUR:2,LABSPEC:2,PHURINE:2,GLUCOSEU:2,HGBUR:2,BILIRUBINUR:2,KETONESUR:2,PROTEINUR:2,UROBILINOGEN:2,NITRITE:2,LEUKOCYTESUR:2 in the last 72 hours Recent Results (from the past 240 hour(s))  URINE CULTURE     Status: Normal   Collection Time   04/21/12  4:46 PM      Component Value Range Status Comment   Specimen Description URINE, CLEAN CATCH   Final  Special Requests NONE   Final    Culture  Setup Time 04/21/2012 22:45   Final    Colony Count 35,000 COLONIES/ML   Final    Culture     Final    Value: GROUP B STREP(S.AGALACTIAE)ISOLATED     Note: TESTING AGAINST S. AGALACTIAE NOT ROUTINELY PERFORMED DUE TO PREDICTABILITY OF AMP/PEN/VAN  SUSCEPTIBILITY.   Report Status 04/24/2012 FINAL   Final   URINE CULTURE     Status: Normal   Collection Time   04/22/12  5:10 AM      Component Value Range Status Comment   Specimen Description URINE, CATHETERIZED   Final    Special Requests NONE   Final    Culture  Setup Time 04/22/2012 09:26   Final    Colony Count NO GROWTH   Final    Culture NO GROWTH   Final    Report Status 04/23/2012 FINAL   Final   MRSA PCR SCREENING     Status: Normal   Collection Time   04/22/12  8:03 AM      Component Value Range Status Comment   MRSA by PCR NEGATIVE  NEGATIVE Final   BODY FLUID CULTURE     Status: Normal   Collection Time   04/22/12 10:52 PM      Component Value Range Status Comment   Specimen Description SYNOVIAL RIGHT KNEE   Final    Special Requests NONE   Final    Gram Stain     Final    Value: ABUNDANT WBC PRESENT, PREDOMINANTLY PMN     MODERATE GRAM POSITIVE COCCI     IN PAIRS IN CHAINS Gram Stain Report Called to,Read Back By and Verified With: Gram Stain Report Called to,Read Back By and Verified With: CAROLYN WHITE 04/23/12 10:43AM BY MILSH   Culture     Final    Value: ABUNDANT GROUP B STREP(S.AGALACTIAE)ISOLATED     Note: TESTING AGAINST S. AGALACTIAE NOT ROUTINELY PERFORMED DUE TO PREDICTABILITY OF AMP/PEN/VAN SUSCEPTIBILITY.   Report Status 04/24/2012 FINAL   Final   SURGICAL PCR SCREEN     Status: Normal   Collection Time   04/23/12 11:09 AM      Component Value Range Status Comment   MRSA, PCR NEGATIVE  NEGATIVE Final    Staphylococcus aureus NEGATIVE  NEGATIVE Final     Studies/Results: No results found.  Medications: Scheduled Meds:    . enoxaparin (LOVENOX) injection  1 mg/kg Subcutaneous Q12H  . insulin aspart  0-15 Units Subcutaneous TID WC  . insulin aspart  3 Units Subcutaneous TID WC  . insulin glargine  10 Units Subcutaneous QHS  . metoprolol  50 mg Oral Daily  . pantoprazole  40 mg Oral BID AC  . piperacillin-tazobactam (ZOSYN)  IV  3.375 g  Intravenous Q8H  . senna-docusate  2 tablet Oral QHS  . sodium chloride  10 mL Intravenous Q12H  . sodium chloride  3 mL Intravenous Q12H  . traZODone  300 mg Oral QHS  . vancomycin  2,000 mg Intravenous Q24H  . warfarin  5 mg Oral ONCE-1800  . Warfarin - Pharmacist Dosing Inpatient   Does not apply q1800   Continuous Infusions:  PRN Meds:.acetaminophen, acetaminophen, morphine injection, nitroGLYCERIN, ondansetron (ZOFRAN) IV, ondansetron, oxyCODONE, polyethylene glycol, promethazine, sorbitol  Assessment/Plan: Septic shock: resolved, de-escalate abx  R knee septic arthritis: s/p knee aspiration 12/17 WBC 200K, changed abx to Vancomycin and Zosyn 12/18, synovial fluid cultures with Group B strep, s/p  Joint irrigation and debridement per Dr.Chandler 12/18 Change to IV Rocephin today, Discussed with Dr.VanDam will consult  UTI (lower urinary tract infection):  - urine cultures negative and re-incubated  Acute kidney injury:  - This most likely secondary to septic shock and NSAIDs use. Improved, stop IVF .  Metabolic encephalopathy:  - Multifactorial most likely secondary to sepsis: resolved   Hyponatremia:  - Is mostly secondary to decreased intravascular volume , resolved  R leg DVT: Continue Coumadin with Lovenox bridge Day 2 today  DM: increase lantus, novolog SSI and with meals  Code Status: full  Family Communication: discussed with patient at bedside and called and talked to wife yesterday Disposition Plan: inpatient    Time spent coordinating care:   LOS: 4 days   [facility] Pager: [phone] 04/25/2012, 11:13 AM

## 2012-04-25 NOTE — Progress Notes (Signed)
PATIENT ID: Anthony Strickland   2 Days Post-Op Procedure(s) (LRB): ARTHROSCOPY KNEE (Right)  Subjective: Reports right knee pain. States this has increased since yesterday. No other complaints or concerns.   Objective:  Filed Vitals:   04/25/12 0836  BP: 159/65  Pulse: 62  Temp: 97.9 F (36.6 C)  Resp: 19    Awake, alert, orientated  R LE ACE was reapplied today  Drain in place  Right knee tender to palpation Mild swelling, no palpable effusion although difficult to tell given body habitus No erythema or warmth Wiggles toes, intact sensation to light touch of toes.  Calves nontender.   Labs:   [facility] 04/25/12 0500 04/24/12 2127 04/24/12 0920 04/24/12 0426 04/23/12 0810  HGB 11.4* 11.4* 11.9* 12.0* 11.9*   Basename 04/25/12 0500 04/24/12 2127  WBC 25.3* 26.2*  RBC 3.79* 3.73*  HCT 34.4* 33.5*  PLT 173 163   Basename 04/25/12 0500 04/24/12 0426  NA 143 138  K 4.2 4.2  CL 109 103  CO2 27 24  BUN 36* 40*  CREATININE 1.17 1.34  GLUCOSE 284* 378*  CALCIUM 8.4 8.7    Assessment and Plan: Increased knee pain- expected post operatively, esp with arthritis WBC elevated, cultures show Group B strep (S. Agalactiae), abx per primary team, would recommend possible ID consult Will plan to pull drain tomrorow Will continue to monitor    VTE proph: per primary team

## 2012-04-25 NOTE — Evaluation (Signed)
Physical Therapy Evaluation Patient Details Name: Anthony Strickland MRN: [id number] DOB: October 13, 1945 Today's Date: 04/25/2012 Time: 1027-2536 PT Time Calculation (min): 26 min  PT Assessment / Plan / Recommendation Clinical Impression  Pt admitted with sepsis s/p right knee I&D. Pt with limited mobility at baseline which has declined even further acutely due to pain. Pt incontinent of stool on arrival and assisted with bed mobility and pericare throughout session. Pt with second period of stool incontinence end of session and assisted with placing pt on bedpan with RN present. Pt states he and wife plan on ST-SNF at discharge which is recommended. Will follow acutely to maximize strength, function and transfers to decrease burden of care. Pt also with noted increased drainage from drain site with bloody drainage on Ace wrap with RN notified.     PT Assessment  Patient needs continued PT services    Follow Up Recommendations  SNF;Supervision/Assistance - 24 hour    Does the patient have the potential to tolerate intense rehabilitation      Barriers to Discharge Decreased caregiver support      Equipment Recommendations  Wheelchair (measurements PT) (bari WC)    Recommendations for Other Services OT consult   Frequency Min 3X/week    Precautions / Restrictions Precautions Precautions: Fall   Pertinent Vitals/Pain 10/10 pain Rknee, RN aware and repositioned HR 81, sats 95% on RA      Mobility  Bed Mobility Bed Mobility: Rolling Right;Rolling Left;Scooting to HOB Rolling Right: 4: Min assist;With rail Rolling Left: 4: Min assist;With rail Scooting to HOB: 1: +2 Total assist Scooting to [facility]: Patient Percentage: 70% Details for Bed Mobility Assistance: Pt with verbal and tactile cueing to perform bed mobility with increased time. Assist of head rails and bed in trendelenburg to scoot to [facility]. Assist for moving legs to position in bed for rolling and mobility Transfers Transfers: Not  assessed (pt denied attempting secondary to  pain) Ambulation/Gait Ambulation/Gait Assistance: Not tested (comment)    Shoulder Instructions     Exercises     PT Diagnosis: Generalized weakness;Acute pain  PT Problem List: Decreased strength;Decreased range of motion;Decreased activity tolerance;Decreased mobility;Pain PT Treatment Interventions: Functional mobility training;Therapeutic activities;Therapeutic exercise;Patient/family education;DME instruction   PT Goals Acute Rehab PT Goals PT Goal Formulation: With patient Time For Goal Achievement: 05/09/12 Potential to Achieve Goals: Fair Pt will Roll Supine to Right Side: with supervision;with rail PT Goal: Rolling Supine to Right Side - Progress: Goal set today Pt will Roll Supine to Left Side: with supervision;with rail PT Goal: Rolling Supine to Left Side - Progress: Goal set today Pt will go Supine/Side to Sit: with mod assist PT Goal: Supine/Side to Sit - Progress: Goal set today Pt will go Sit to Supine/Side: with mod assist PT Goal: Sit to Supine/Side - Progress: Goal set today Pt will go Sit to Stand: with +2 total assist (pt = 50%) PT Goal: Sit to Stand - Progress: Goal set today Pt will go Stand to Sit: with +2 total assist (pt=50%) PT Goal: Stand to Sit - Progress: Goal set today Pt will Transfer Bed to Chair/Chair to Bed: with +2 total assist (pt=50%) PT Transfer Goal: Bed to Chair/Chair to Bed - Progress: Goal set today  Visit Information  Last PT Received On: 04/25/12 Assistance Needed: +2    Subjective Data  Subjective: My knee started hurting about 8 days ago Patient Stated Goal: be able to walk   Prior Functioning  Home Living Lives With: Spouse Available Help at  Discharge: Family;Available 24 hours/day Type of Home: House Home Access: Level entry Home Layout: Two level Alternate Level Stairs-Number of Steps: flight with chair lift Bathroom Shower/Tub: Walk-in shower Bathroom Toilet: Standard Home  Adaptive Equipment: Walker - rolling;Straight cane Prior Function Level of Independence: Independent with assistive device(s);Needs assistance Needs Assistance: Light Housekeeping;Meal Prep Meal Prep: Total Light Housekeeping: Total Able to Take Stairs?: No Driving: No Vocation: Unemployed Comments: Pt states dgtr does the housework and cooking. Pt typically only walks 25' at a time with RW usually short distances in the house and to the car Communication Communication: No difficulties    Cognition  Overall Cognitive Status: Appears within functional limits for tasks assessed/performed Arousal/Alertness: Awake/alert Orientation Level: Appears intact for tasks assessed Behavior During Session: [facility] for tasks performed Cognition - Other Comments: Pt unaware he was incontinent of stool    Extremity/Trunk Assessment Right Upper Extremity Assessment RUE ROM/Strength/Tone: [facility] for tasks assessed Left Upper Extremity Assessment LUE ROM/Strength/Tone: WFL for tasks assessed Right Lower Extremity Assessment RLE ROM/Strength/Tone: Deficits;Due to pain RLE ROM/Strength/Tone Deficits: Pt able to initiate lifting leg and bending knee but unable to complete without assist due to pain and only noted flexion grossly 20degrees RLE Sensation: WFL - Light Touch Left Lower Extremity Assessment LLE ROM/Strength/Tone: Deficits LLE ROM/Strength/Tone Deficits: grossly 2+/5, knee flexion witnessed to grossly 30 and hip flexion grossly 20 LLE Sensation: WFL - Light Touch   Balance    End of Session PT - End of Session Activity Tolerance: Patient limited by pain Patient left: in bed;with call bell/phone within reach;with nursing in room Nurse Communication: Mobility status  GP     Delorse Lek 04/25/2012, 3:52 PM  Delaney Meigs, PT [phone]

## 2012-04-25 NOTE — Progress Notes (Signed)
Wife states pt has been to [facility] in [facility] and had cardiac workup in past 2-3 months. Pt sees           Dr Anthony Sar.

## 2012-04-25 NOTE — Progress Notes (Signed)
Inpatient Diabetes Program Recommendations  AACE/ADA: New Consensus Statement on Inpatient Glycemic Control (2013)  Target Ranges:  Prepandial:   less than 140 mg/dL      Peak postprandial:   less than 180 mg/dL (1-2 hours)      Critically ill patients:  140 - 180 mg/dL   Reason for Visit: Results for Anthony Strickland, Anthony Strickland (MRN [id number]) as of 04/25/2012 12:34  Ref. Range 04/22/2012 05:25  Hemoglobin A1C Latest Range: <5.7 % 6.5 (H)   Note new onset diabetes.  Discussed with patient and wife.  She states that his PCP is the [place] hospital/physician.  CBG's greater than 300 mg/dL in the hospital likely due to the acute stress/infection.  Please consider increasing Lantus to 20 units daily.   Will need Rx. For meter at discharge so that patient can test.  Wife is a Engineer, civil (consulting) and reports that they both would benefit from more information and education regarding diabetes.  Will order videos, diabetes book, and outpatient diabetes education per protocol.  Wife very appreciative.

## 2012-04-25 NOTE — Progress Notes (Signed)
PHARMACY CONSULT NOTE - Follow Up Consult  Pharmacy Consult for Lovenox, Coumadin, Vancomycin, Zosyn Indication: DVT, septic knee  No Known Allergies  Patient Measurements: Height: 5' 8.5" (174 cm) Weight: 353 lb 6.4 oz (160.3 kg) IBW/kg (Calculated) : 69.55   Vital Signs: Temp: 97.9 F (36.6 C) (12/20 0836) Temp src: Oral (12/20 0836) BP: 147/58 mmHg (12/20 0900) Pulse Rate: 67  (12/20 0900)  Labs:  Basename 04/25/12 0500 04/24/12 2127 04/24/12 0920 04/24/12 0426 04/23/12 0810  HGB 11.4* 11.4* -- -- --  HCT 34.4* 33.5* 35.1* -- --  PLT 173 163 155 -- --  APTT -- -- -- -- --  LABPROT 16.1* -- -- -- --  INR 1.32 -- -- -- --  HEPARINUNFRC -- -- -- -- --  CREATININE 1.17 -- -- 1.34 1.71*  CKTOTAL -- -- -- -- --  CKMB -- -- -- -- --  TROPONINI -- -- -- -- --    Estimated Creatinine Clearance: 94.3 ml/min (by C-G formula based on Cr of 1.17).  Results for orders placed during the hospital encounter of 04/21/12  URINE CULTURE     Status: Normal   Collection Time   04/21/12  4:46 PM      Component Value Range Status Comment   Specimen Description URINE, CLEAN CATCH   Final    Special Requests NONE   Final    Culture  Setup Time 04/21/2012 22:45   Final    Colony Count 35,000 COLONIES/ML   Final    Culture     Final    Value: GROUP B STREP(S.AGALACTIAE)ISOLATED     Note: TESTING AGAINST S. AGALACTIAE NOT ROUTINELY PERFORMED DUE TO PREDICTABILITY OF AMP/PEN/VAN SUSCEPTIBILITY.   Report Status 04/24/2012 FINAL   Final   URINE CULTURE     Status: Normal   Collection Time   04/22/12  5:10 AM      Component Value Range Status Comment   Specimen Description URINE, CATHETERIZED   Final    Special Requests NONE   Final    Culture  Setup Time 04/22/2012 09:26   Final    Colony Count NO GROWTH   Final    Culture NO GROWTH   Final    Report Status 04/23/2012 FINAL   Final   MRSA PCR SCREENING     Status: Normal   Collection Time   04/22/12  8:03 AM      Component Value  Range Status Comment   MRSA by PCR NEGATIVE  NEGATIVE Final   BODY FLUID CULTURE     Status: Normal   Collection Time   04/22/12 10:52 PM      Component Value Range Status Comment   Specimen Description SYNOVIAL RIGHT KNEE   Final    Special Requests NONE   Final    Gram Stain     Final    Value: ABUNDANT WBC PRESENT, PREDOMINANTLY PMN     MODERATE GRAM POSITIVE COCCI     IN PAIRS IN CHAINS Gram Stain Report Called to,Read Back By and Verified With: Gram Stain Report Called to,Read Back By and Verified With: CAROLYN WHITE 04/23/12 10:43AM BY MILSH   Culture     Final    Value: ABUNDANT GROUP B STREP(S.AGALACTIAE)ISOLATED     Note: TESTING AGAINST S. AGALACTIAE NOT ROUTINELY PERFORMED DUE TO PREDICTABILITY OF AMP/PEN/VAN SUSCEPTIBILITY.   Report Status 04/24/2012 FINAL   Final   SURGICAL PCR SCREEN     Status: Normal   Collection Time  04/23/12 11:09 AM      Component Value Range Status Comment   MRSA, PCR NEGATIVE  NEGATIVE Final    Staphylococcus aureus NEGATIVE  NEGATIVE Final       Medications:  Scheduled:     . [COMPLETED] coumadin book   Does not apply Once  . [EXPIRED] enoxaparin (LOVENOX) injection  1 mg/kg Subcutaneous NOW  . enoxaparin (LOVENOX) injection  1 mg/kg Subcutaneous Q12H  . insulin aspart  0-15 Units Subcutaneous TID WC  . insulin aspart  3 Units Subcutaneous TID WC  . insulin glargine  10 Units Subcutaneous QHS  . metoprolol  50 mg Oral Daily  . pantoprazole (PROTONIX) IV  40 mg Intravenous Q12H  . piperacillin-tazobactam (ZOSYN)  IV  3.375 g Intravenous Q8H  . senna-docusate  2 tablet Oral QHS  . sodium chloride  10 mL Intravenous Q12H  . sodium chloride  3 mL Intravenous Q12H  . traZODone  300 mg Oral QHS  . vancomycin  2,000 mg Intravenous Q24H  . [COMPLETED] warfarin  5 mg Oral ONCE-1800  . [COMPLETED] warfarin   Does not apply Once  . Warfarin - Pharmacist Dosing Inpatient   Does not apply q1800  . [DISCONTINUED] enoxaparin (LOVENOX) injection   1 mg/kg Subcutaneous Q12H    Assessment: RLE DVT:  On Day #2 of a minimum 5-day overlap of Lovenox to Warfarin.  INR is essentially unchanged after his first dose of Coumadin.  His CBC is stable and no bleeding is noted.  Septic Knee:  On Day #5 of antibiotics, Day #3 of Vancomycin and Zosyn.  Cultures reveal Group B Strep which is usually susceptible to penicillins and cephalosporins.  Goal of Therapy:  INR 2-3 Monitor platelets by anticoagulation protocol: Yes   Plan:  Continue Lovenox 165mg  SQ Q12h Continue with Coumadin 5mg  today Daily PT/INR monitoring Continue Zosyn 3.375gm IV q8h and Vancomycin 2gm IV q24h  If appropriate, consider deescalation of antibiotics to a penicillin or cephalosporin - Rocephin 2gm IV q24h would be a good choice if IV therapy is desired.  Estella Husk, Pharm.D., BCPS Clinical Pharmacist  Phone [phone] Pager [phone] 04/25/2012, 10:19 AM

## 2012-04-26 DIAGNOSIS — I339 Acute and subacute endocarditis, unspecified: Secondary | ICD-10-CM

## 2012-04-26 LAB — BASIC METABOLIC PANEL
BUN: 27 mg/dL — ABNORMAL HIGH (ref 6–23)
Calcium: 8.4 mg/dL (ref 8.4–10.5)
Creatinine, Ser: 1.06 mg/dL (ref 0.50–1.35)
GFR calc non Af Amer: 72 mL/min — ABNORMAL LOW (ref >90)
Glucose, Bld: 215 mg/dL — ABNORMAL HIGH (ref 70–99)
Sodium: 138 mEq/L (ref 135–145)

## 2012-04-26 LAB — PROTIME-INR: Prothrombin Time: 19.9 seconds — ABNORMAL HIGH (ref 11.6–15.2)

## 2012-04-26 LAB — CBC
Hemoglobin: 11.8 g/dL — ABNORMAL LOW (ref 13.0–17.0)
MCH: 30.5 pg (ref 26.0–34.0)
MCHC: 32.7 g/dL (ref 30.0–36.0)
MCV: 93.3 fL (ref 78.0–100.0)

## 2012-04-26 LAB — GLUCOSE, CAPILLARY
Glucose-Capillary: 202 mg/dL — ABNORMAL HIGH (ref 70–99)
Glucose-Capillary: 230 mg/dL — ABNORMAL HIGH (ref 70–99)
Glucose-Capillary: 170 mg/dL — ABNORMAL HIGH (ref 70–99)

## 2012-04-26 LAB — C-REACTIVE PROTEIN: CRP: 5.1 mg/dL — ABNORMAL HIGH (ref <0.60)

## 2012-04-26 MED ORDER — CITALOPRAM HYDROBROMIDE 40 MG PO TABS
40.0000 mg | ORAL_TABLET | Freq: Every day | ORAL | Status: DC
  Administered 2012-04-26 – 2012-04-29 (×4): 40 mg via ORAL
  Filled 2012-04-26 (×4): qty 1

## 2012-04-26 MED ORDER — WARFARIN SODIUM 3 MG PO TABS
3.0000 mg | ORAL_TABLET | Freq: Once | ORAL | Status: AC
  Administered 2012-04-26: 3 mg via ORAL
  Filled 2012-04-26: qty 1

## 2012-04-26 MED ORDER — INSULIN GLARGINE 100 UNIT/ML ~~LOC~~ SOLN
20.0000 [IU] | Freq: Every day | SUBCUTANEOUS | Status: DC
  Administered 2012-04-26: 20 [IU] via SUBCUTANEOUS

## 2012-04-26 NOTE — Progress Notes (Signed)
Awake, alert, oriented.  Complains of pain in right knee.  No N/V.  Exam of right knee demonstrates some bloody drainage on the dressing - it was changed today.  Surgical portals appear benign.  Drain output was 30, so both drains were pulled today.  A: s/p arthroscopic washout of infected right knee  P: Continue antibiotics per infectious disease.  Dressing change as needed.

## 2012-04-26 NOTE — Progress Notes (Signed)
Dr. Jomarie Longs called regarding need to obtain labs and blood cultures prior to PICC. Telephone order given that states it is okay to obtain blood cultures via Respiratory Arterial Stick. RT and lab notified.

## 2012-04-26 NOTE — Progress Notes (Signed)
[facility]             Progress Note   Subjective: Breathing better, R knee pain improved, ambulating minimally  Objective: Vital signs in last 24 hours: Temp:  [97.2 F (36.2 C)-98.1 F (36.7 C)] 98.1 F (36.7 C) (12/21 1121) Pulse Rate:  [61-83] 83  (12/21 1121) Resp:  [17-23] 20  (12/21 1121) BP: (151-168)/(69-88) 154/69 mmHg (12/21 1121) SpO2:  [95 %-100 %] 97 % (12/21 1121) Weight:  [166.5 kg (367 lb 1.1 oz)] 166.5 kg (367 lb 1.1 oz) (12/21 0427) Weight change: 6.2 kg (13 lb 10.7 oz) Last BM Date: 04/26/12  Intake/Output from previous day: 12/20 0701 - 12/21 0700 In: 1130 [P.O.:1080; IV Piggyback:50] Out: 1738 [Urine:1675; Drains:60; Stool:3] Total I/O In: 360 [P.O.:360] Out: 250 [Urine:250]   Physical Exam: General: Alert, awake, oriented x3, in no acute distress. HEENT: No bruits, no goiter. Heart: Regular rate and rhythm, without murmurs, rubs, gallops. Lungs: Clear to auscultation bilaterally, distant BS at bases Abdomen: Soft, nontender, nondistended, positive bowel sounds. Extremities: R knee with ACE dressing, RLE edema 2 plus with positive pedal pulses. Neuro: Grossly intact, nonfocal.    Lab Results: Basic Metabolic Panel:  Basename 04/26/12 0530 04/25/12 0500  NA 138 143  K 4.0 4.2  CL 104 109  CO2 26 27  GLUCOSE 215* 284*  BUN 27* 36*  CREATININE 1.06 1.17  CALCIUM 8.4 8.4  MG -- --  PHOS -- --   Liver Function Tests: No results found for this basename: AST:2,ALT:2,ALKPHOS:2,BILITOT:2,PROT:2,ALBUMIN:2 in the last 72 hours No results found for this basename: LIPASE:2,AMYLASE:2 in the last 72 hours No results found for this basename: AMMONIA:2 in the last 72 hours CBC:  Basename 04/26/12 0530 04/25/12 0500  WBC 24.2* 25.3*  NEUTROABS -- --  HGB 11.8* 11.4*  HCT 36.1* 34.4*  MCV 93.3 90.8  PLT 177 173   Cardiac Enzymes: No results found for this basename: CKTOTAL:3,CKMB:3,CKMBINDEX:3,TROPONINI:3 in the last 72  hours BNP: No results found for this basename: PROBNP:3 in the last 72 hours D-Dimer: No results found for this basename: DDIMER:2 in the last 72 hours CBG:  Basename 04/26/12 0831 04/25/12 1950 04/25/12 1619 04/25/12 1240 04/25/12 0830 04/24/12 2058  GLUCAP 203* 202* 275* 261* 281* 253*   Hemoglobin A1C: No results found for this basename: HGBA1C in the last 72 hours Fasting Lipid Panel: No results found for this basename: CHOL,HDL,LDLCALC,TRIG,CHOLHDL,LDLDIRECT in the last 72 hours Thyroid Function Tests: No results found for this basename: TSH,T4TOTAL,FREET4,T3FREE,THYROIDAB in the last 72 hours Anemia Panel: No results found for this basename: VITAMINB12,FOLATE,FERRITIN,TIBC,IRON,RETICCTPCT in the last 72 hours Coagulation:  Basename 04/26/12 0530 04/25/12 0500  LABPROT 19.9* 16.1*  INR 1.76* 1.32   Urine Drug Screen: Drugs of Abuse     Component Value Date/Time   LABOPIA NONE DETECTED 04/21/2012 1646   COCAINSCRNUR NONE DETECTED 04/21/2012 1646   LABBENZ NONE DETECTED 04/21/2012 1646   AMPHETMU NONE DETECTED 04/21/2012 1646   THCU NONE DETECTED 04/21/2012 1646   LABBARB NONE DETECTED 04/21/2012 1646    Alcohol Level: No results found for this basename: ETH:2 in the last 72 hours Urinalysis: No results found for this basename: COLORURINE:2,APPERANCEUR:2,LABSPEC:2,PHURINE:2,GLUCOSEU:2,HGBUR:2,BILIRUBINUR:2,KETONESUR:2,PROTEINUR:2,UROBILINOGEN:2,NITRITE:2,LEUKOCYTESUR:2 in the last 72 hours Recent Results (from the past 240 hour(s))  URINE CULTURE     Status: Normal   Collection Time   04/21/12  4:46 PM      Component Value Range Status Comment   Specimen Description URINE, CLEAN CATCH   Final  Special Requests NONE   Final    Culture  Setup Time 04/21/2012 22:45   Final    Colony Count 35,000 COLONIES/ML   Final    Culture     Final    Value: GROUP B STREP(S.AGALACTIAE)ISOLATED     Note: TESTING AGAINST S. AGALACTIAE NOT ROUTINELY PERFORMED DUE TO PREDICTABILITY  OF AMP/PEN/VAN SUSCEPTIBILITY.   Report Status 04/24/2012 FINAL   Final   URINE CULTURE     Status: Normal   Collection Time   04/22/12  5:10 AM      Component Value Range Status Comment   Specimen Description URINE, CATHETERIZED   Final    Special Requests NONE   Final    Culture  Setup Time 04/22/2012 09:26   Final    Colony Count NO GROWTH   Final    Culture NO GROWTH   Final    Report Status 04/23/2012 FINAL   Final   MRSA PCR SCREENING     Status: Normal   Collection Time   04/22/12  8:03 AM      Component Value Range Status Comment   MRSA by PCR NEGATIVE  NEGATIVE Final   BODY FLUID CULTURE     Status: Normal   Collection Time   04/22/12 10:52 PM      Component Value Range Status Comment   Specimen Description SYNOVIAL RIGHT KNEE   Final    Special Requests NONE   Final    Gram Stain     Final    Value: ABUNDANT WBC PRESENT, PREDOMINANTLY PMN     MODERATE GRAM POSITIVE COCCI     IN PAIRS IN CHAINS Gram Stain Report Called to,Read Back By and Verified With: Gram Stain Report Called to,Read Back By and Verified With: CAROLYN WHITE 04/23/12 10:43AM BY MILSH   Culture     Final    Value: ABUNDANT GROUP B STREP(S.AGALACTIAE)ISOLATED     Note: TESTING AGAINST S. AGALACTIAE NOT ROUTINELY PERFORMED DUE TO PREDICTABILITY OF AMP/PEN/VAN SUSCEPTIBILITY.   Report Status 04/24/2012 FINAL   Final   SURGICAL PCR SCREEN     Status: Normal   Collection Time   04/23/12 11:09 AM      Component Value Range Status Comment   MRSA, PCR NEGATIVE  NEGATIVE Final    Staphylococcus aureus NEGATIVE  NEGATIVE Final     Studies/Results: No results found.  Medications: Scheduled Meds:    . cefTRIAXone (ROCEPHIN)  IV  2 g Intravenous Q24H  . citalopram  40 mg Oral Daily  . enoxaparin (LOVENOX) injection  1 mg/kg Subcutaneous Q12H  . insulin aspart  0-15 Units Subcutaneous TID WC  . insulin aspart  3 Units Subcutaneous TID WC  . insulin glargine  20 Units Subcutaneous QHS  . living well with  diabetes book   Does not apply Once  . metoprolol  50 mg Oral Daily  . pantoprazole  40 mg Oral BID AC  . senna-docusate  2 tablet Oral QHS  . sodium chloride  10 mL Intravenous Q12H  . sodium chloride  3 mL Intravenous Q12H  . traZODone  300 mg Oral QHS  . warfarin  3 mg Oral ONCE-1800  . Warfarin - Pharmacist Dosing Inpatient   Does not apply q1800   Continuous Infusions:  PRN Meds:.acetaminophen, acetaminophen, morphine injection, nitroGLYCERIN, ondansetron (ZOFRAN) IV, ondansetron, oxyCODONE, polyethylene glycol, promethazine, sorbitol  Assessment/Plan: Septic shock: resolved, de-escalated abx  R knee septic arthritis: s/p knee aspiration 12/17 WBC 200K, changed abx to  Vancomycin and Zosyn 12/18, synovial fluid cultures with Group B strep, s/p  Joint irrigation and debridement per Dr.Chandler 12/18 Changed to IV Rocephin 12/20, will need this for 6 weeks, Greatly appreciate ID consult Blood cultures drawn yesterday 2D Echo done results pending  UTI (lower urinary tract infection):  - urine cultures negative and re-incubated  Acute kidney injury:  - This most likely secondary to septic shock and NSAIDs use. Improved, stop IVF .  Metabolic encephalopathy:  - Multifactorial most likely secondary to sepsis: resolved   Hyponatremia:  - Is mostly secondary to decreased intravascular volume , resolved  R leg DVT: Continue Coumadin with Lovenox bridge Day 3 today  DM: increase lantus, novolog SSI and with meals  Code Status: full  Family Communication: discussed with patient at bedside and called and talked to wife yesterday Disposition Plan: transfer to floor    Time spent coordinating care:   LOS: 5 days   [facility] Pager: [phone] 04/26/2012, 11:39 AM

## 2012-04-26 NOTE — Progress Notes (Signed)
Lab unable to obtain blood cultures x2 attempts, Dr. Jomarie Longs notified.

## 2012-04-26 NOTE — Progress Notes (Signed)
Pt to [id number] in care of Revonda Standard, RN who is at bedside. Wife and daughter also at bedside.

## 2012-04-26 NOTE — Progress Notes (Signed)
Echo in progress at bedside.

## 2012-04-26 NOTE — Progress Notes (Signed)
04/26/2012 11:38 AM  Pt transferred from 2600.  Pt fully alert and oriented, accompanied by wife with whom he lives with and his daughter.  Pt complaining of 10/10 pain in his right knee-see MAR for med administration.  Pt states he walks with a cane at  Home, but is very weak since he has been in the hospital.  Pt is morbidly obese on a bariatric bed.  Skin is intact except for a fissure located to his coccyx without drainage and a bruise to his left forearm from blood draws, per patient.  Pt is on room air.  Pt and family oriented to room/unit, and was instructed on how to utilize the call bell, to which they verbalized understanding.  Pt is a moderate fall risk, red socks will not fit on the patient as he is so swollen, but I did put a yellow arm band on the patient and explain the fall risk to him and his family, to which they verbalized understanding.  Unable to put bed alarm on due to bariatric bed not having an alarm, however, the patient is in a camera room.  Will continue to monitor patient. Eunice Blase

## 2012-04-26 NOTE — Progress Notes (Signed)
PHARMACY CONSULT NOTE - Follow Up Consult  Pharmacy Consult for Lovenox + Warfarin Indication: DVT  No Known Allergies  Patient Measurements: Height: 5' 8.5" (174 cm) Weight: 367 lb 1.1 oz (166.5 kg) IBW/kg (Calculated) : 69.55   Vital Signs: Temp: 97.2 F (36.2 C) (12/21 0800) Temp src: Oral (12/21 0800) BP: 151/70 mmHg (12/21 0800) Pulse Rate: 70  (12/21 0800)  Labs:  Basename 04/26/12 0530 04/25/12 0500 04/24/12 2127 04/24/12 0426  HGB 11.8* 11.4* -- --  HCT 36.1* 34.4* 33.5* --  PLT 177 173 163 --  APTT -- -- -- --  LABPROT 19.9* 16.1* -- --  INR 1.76* 1.32 -- --  HEPARINUNFRC -- -- -- --  CREATININE 1.06 1.17 -- 1.34  CKTOTAL -- -- -- --  CKMB -- -- -- --  TROPONINI -- -- -- --    Estimated Creatinine Clearance: 106.5 ml/min (by C-G formula based on Cr of 1.06).  Assessment: 66 y.o M on Day #3 of a minimum 5-day overlap of Lovenox to Warfarin for RLE DVT  INR this morning remains SUBtherapeutic however is trending up (INR 1.76 << 1.32, goal of 2-3). Hgb/Hct/Plt stable. A small amount of bloody drainage noted around hemovac site -- will monitor. SCr 1.06, CrCl~70-80 ml/min (normalized), Wt~166 kg, lovenox dose remains appropriate.   Goal of Therapy:  INR 2-3 Monitor platelets by anticoagulation protocol: Yes   Plan:  1. Continue Lovenox 165 mg SQ every 12 hours 2. Warfarin 3 mg x 1 dose at 1800 today 3. Will continue to monitor for any signs/symptoms of bleeding and will follow up with PT/INR in the a.m.   Georgina Pillion, PharmD, BCPS Clinical Pharmacist Pager: [phone] 04/26/2012 11:23 AM

## 2012-04-26 NOTE — Progress Notes (Signed)
Patient's drank at least 8 or more cups of water during shift. MD notified. Patient presents with +2 generalized edema to all extremities. Vitals sign stable.   Dressing to right knee reinforced due to small amount of bloody drainage around hemovac site. Will continue to monitor.

## 2012-04-26 NOTE — Progress Notes (Signed)
Patient's right triple lumen IJ removed today at 0640 per protocol. Vital signs stable. Will continue to monitor.

## 2012-04-26 NOTE — Progress Notes (Signed)
  Echocardiogram 2D Echocardiogram has been performed.  Anthony Strickland 04/26/2012, 12:11 PM

## 2012-04-26 NOTE — Progress Notes (Signed)
PA at bedside doing dressing change.

## 2012-04-26 NOTE — Progress Notes (Signed)
Report to Revonda Standard, RN to assume care of patient at transfer to [phone].

## 2012-04-26 NOTE — Progress Notes (Signed)
Patient eating

## 2012-04-26 NOTE — Progress Notes (Signed)
[employer] portable equipment representative at bedside to assist with functioning of bed.

## 2012-04-26 NOTE — Progress Notes (Signed)
Patient's dressing to right knee hemovac site has bloody drainage. Vital signs stable. Hemovac patent and draining. Drainage area marked and will continue to monitor site and patient condition.

## 2012-04-27 LAB — GLUCOSE, CAPILLARY: Glucose-Capillary: 191 mg/dL — ABNORMAL HIGH (ref 70–99)

## 2012-04-27 LAB — PROTIME-INR: Prothrombin Time: 27.8 seconds — ABNORMAL HIGH (ref 11.6–15.2)

## 2012-04-27 MED ORDER — OXYCODONE HCL 5 MG PO TABS
10.0000 mg | ORAL_TABLET | ORAL | Status: DC | PRN
  Administered 2012-04-28 – 2012-05-01 (×12): 10 mg via ORAL
  Filled 2012-04-27 (×12): qty 2

## 2012-04-27 MED ORDER — FUROSEMIDE 20 MG PO TABS
20.0000 mg | ORAL_TABLET | Freq: Two times a day (BID) | ORAL | Status: DC
  Administered 2012-04-27 – 2012-04-28 (×3): 20 mg via ORAL
  Filled 2012-04-27 (×5): qty 1

## 2012-04-27 MED ORDER — INSULIN GLARGINE 100 UNIT/ML ~~LOC~~ SOLN
25.0000 [IU] | Freq: Every day | SUBCUTANEOUS | Status: DC
  Administered 2012-04-27 – 2012-05-01 (×5): 25 [IU] via SUBCUTANEOUS

## 2012-04-27 MED ORDER — WARFARIN SODIUM 3 MG PO TABS
3.0000 mg | ORAL_TABLET | Freq: Once | ORAL | Status: DC
  Filled 2012-04-27: qty 1

## 2012-04-27 MED ORDER — WARFARIN SODIUM 1 MG PO TABS
1.0000 mg | ORAL_TABLET | Freq: Once | ORAL | Status: AC
  Administered 2012-04-27: 1 mg via ORAL
  Filled 2012-04-27: qty 1

## 2012-04-27 MED ORDER — POTASSIUM CHLORIDE CRYS ER 20 MEQ PO TBCR
20.0000 meq | EXTENDED_RELEASE_TABLET | Freq: Every day | ORAL | Status: DC
  Administered 2012-04-27: 20 meq via ORAL
  Filled 2012-04-27 (×2): qty 1

## 2012-04-27 NOTE — Progress Notes (Signed)
Lab draws on hold d/t awaiting PICC placement - pending 2 negative blood cx

## 2012-04-27 NOTE — Progress Notes (Addendum)
PHARMACY CONSULT NOTE - Follow Up Consult  Pharmacy Consult for Lovenox + Warfarin Indication: DVT  No Known Allergies  Patient Measurements: Height: 5' 8.5" (174 cm) Weight: 363 lb 8.6 oz (164.9 kg) IBW/kg (Calculated) : 69.55   Vital Signs: Temp: 97.2 F (36.2 C) (12/22 1021) Temp src: Oral (12/22 1021) BP: 173/92 mmHg (12/22 1021) Pulse Rate: 90  (12/22 1021)  Labs:  Basename 04/27/12 1611 04/26/12 0530 04/25/12 0500 04/24/12 2127  HGB -- 11.8* 11.4* --  HCT -- 36.1* 34.4* 33.5*  PLT -- 177 173 163  APTT -- -- -- --  LABPROT 27.8* 19.9* 16.1* --  INR 2.76* 1.76* 1.32 --  HEPARINUNFRC -- -- -- --  CREATININE -- 1.06 1.17 --  CKTOTAL -- -- -- --  CKMB -- -- -- --  TROPONINI -- -- -- --    Estimated Creatinine Clearance: 105.8 ml/min (by C-G formula based on Cr of 1.06).  Assessment: 66 y.o M on Day #4 of a minimum 5-day overlap of Lovenox to Warfarin for RLE DVT. INR appears therapeutic with rapid increase BUT bridge needs to continue for one more day to ensure all clotting factors have been blacked by Warfarin. No bleeding noted, Hgb/Hct/Plt stable.    Goal of Therapy:  INR 2-3 Monitor platelets by anticoagulation protocol: Yes   Plan:  1. Continue Lovenox 165 mg SQ every 12 hours 2. Warfarin 1 mg po tonight 3. Will continue to monitor for any signs/symptoms of bleeding and will follow up with PT/INR in the a.m.   [facility], Pharm.D., BCPS Clinical Pharmacist Pager: [phone] 04/27/2012 4:59 PM

## 2012-04-27 NOTE — Progress Notes (Addendum)
[facility]             Progress Note   Subjective: Breathing better, R knee pain still very severe, PO intake poor, ambulation minimal  Objective: Vital signs in last 24 hours: Temp:  [98 F (36.7 C)-98.1 F (36.7 C)] 98.1 F (36.7 C) (12/22 0603) Pulse Rate:  [76-83] 80  (12/22 0603) Resp:  [18-20] 19  (12/21 2119) BP: (154-174)/(69-95) 174/77 mmHg (12/22 0956) SpO2:  [97 %-99 %] 99 % (12/22 0603) Weight:  [164.9 kg (363 lb 8.6 oz)] 164.9 kg (363 lb 8.6 oz) (12/21 2119) Weight change: -1.6 kg (-3 lb 8.4 oz) Last BM Date: 04/26/12  Intake/Output from previous day: 12/21 0701 - 12/22 0700 In: 1010 [P.O.:960; IV Piggyback:50] Out: 2100 [Urine:2100]     Physical Exam: General: Alert, awake, oriented x3, in no acute distress. HEENT: No bruits, no goiter. Heart: Regular rate and rhythm, without murmurs, rubs, gallops. Lungs: Clear to auscultation bilaterally, distant BS at bases Abdomen: Soft, nontender, nondistended, positive bowel sounds. Extremities: R knee with ACE dressing, RLE edema 2 plus, LLE 1 plus edema with positive pedal pulses. Neuro: Grossly intact, nonfocal.    Lab Results: Basic Metabolic Panel:  Basename 04/26/12 0530 04/25/12 0500  NA 138 143  K 4.0 4.2  CL 104 109  CO2 26 27  GLUCOSE 215* 284*  BUN 27* 36*  CREATININE 1.06 1.17  CALCIUM 8.4 8.4  MG -- --  PHOS -- --   Liver Function Tests: No results found for this basename: AST:2,ALT:2,ALKPHOS:2,BILITOT:2,PROT:2,ALBUMIN:2 in the last 72 hours No results found for this basename: LIPASE:2,AMYLASE:2 in the last 72 hours No results found for this basename: AMMONIA:2 in the last 72 hours CBC:  Basename 04/26/12 0530 04/25/12 0500  WBC 24.2* 25.3*  NEUTROABS -- --  HGB 11.8* 11.4*  HCT 36.1* 34.4*  MCV 93.3 90.8  PLT 177 173   Cardiac Enzymes: No results found for this basename: CKTOTAL:3,CKMB:3,CKMBINDEX:3,TROPONINI:3 in the last 72 hours BNP: No results found for this  basename: PROBNP:3 in the last 72 hours D-Dimer: No results found for this basename: DDIMER:2 in the last 72 hours CBG:  Basename 04/27/12 0737 04/26/12 2131 04/26/12 1604 04/26/12 1116 04/26/12 0831 04/25/12 1950  GLUCAP 191* 170* 230* 202* 203* 202*   Hemoglobin A1C: No results found for this basename: HGBA1C in the last 72 hours Fasting Lipid Panel: No results found for this basename: CHOL,HDL,LDLCALC,TRIG,CHOLHDL,LDLDIRECT in the last 72 hours Thyroid Function Tests: No results found for this basename: TSH,T4TOTAL,FREET4,T3FREE,THYROIDAB in the last 72 hours Anemia Panel: No results found for this basename: VITAMINB12,FOLATE,FERRITIN,TIBC,IRON,RETICCTPCT in the last 72 hours Coagulation:  Basename 04/26/12 0530 04/25/12 0500  LABPROT 19.9* 16.1*  INR 1.76* 1.32   Urine Drug Screen: Drugs of Abuse     Component Value Date/Time   LABOPIA NONE DETECTED 04/21/2012 1646   COCAINSCRNUR NONE DETECTED 04/21/2012 1646   LABBENZ NONE DETECTED 04/21/2012 1646   AMPHETMU NONE DETECTED 04/21/2012 1646   THCU NONE DETECTED 04/21/2012 1646   LABBARB NONE DETECTED 04/21/2012 1646    Alcohol Level: No results found for this basename: ETH:2 in the last 72 hours Urinalysis: No results found for this basename: COLORURINE:2,APPERANCEUR:2,LABSPEC:2,PHURINE:2,GLUCOSEU:2,HGBUR:2,BILIRUBINUR:2,KETONESUR:2,PROTEINUR:2,UROBILINOGEN:2,NITRITE:2,LEUKOCYTESUR:2 in the last 72 hours Recent Results (from the past 240 hour(s))  URINE CULTURE     Status: Normal   Collection Time   04/21/12  4:46 PM      Component Value Range Status Comment   Specimen Description URINE, CLEAN CATCH   Final  Special Requests NONE   Final    Culture  Setup Time 04/21/2012 22:45   Final    Colony Count 35,000 COLONIES/ML   Final    Culture     Final    Value: GROUP B STREP(S.AGALACTIAE)ISOLATED     Note: TESTING AGAINST S. AGALACTIAE NOT ROUTINELY PERFORMED DUE TO PREDICTABILITY OF AMP/PEN/VAN SUSCEPTIBILITY.    Report Status 04/24/2012 FINAL   Final   URINE CULTURE     Status: Normal   Collection Time   04/22/12  5:10 AM      Component Value Range Status Comment   Specimen Description URINE, CATHETERIZED   Final    Special Requests NONE   Final    Culture  Setup Time 04/22/2012 09:26   Final    Colony Count NO GROWTH   Final    Culture NO GROWTH   Final    Report Status 04/23/2012 FINAL   Final   MRSA PCR SCREENING     Status: Normal   Collection Time   04/22/12  8:03 AM      Component Value Range Status Comment   MRSA by PCR NEGATIVE  NEGATIVE Final   BODY FLUID CULTURE     Status: Normal   Collection Time   04/22/12 10:52 PM      Component Value Range Status Comment   Specimen Description SYNOVIAL RIGHT KNEE   Final    Special Requests NONE   Final    Gram Stain     Final    Value: ABUNDANT WBC PRESENT, PREDOMINANTLY PMN     MODERATE GRAM POSITIVE COCCI     IN PAIRS IN CHAINS Gram Stain Report Called to,Read Back By and Verified With: Gram Stain Report Called to,Read Back By and Verified With: CAROLYN WHITE 04/23/12 10:43AM BY MILSH   Culture     Final    Value: ABUNDANT GROUP B STREP(S.AGALACTIAE)ISOLATED     Note: TESTING AGAINST S. AGALACTIAE NOT ROUTINELY PERFORMED DUE TO PREDICTABILITY OF AMP/PEN/VAN SUSCEPTIBILITY.   Report Status 04/24/2012 FINAL   Final   SURGICAL PCR SCREEN     Status: Normal   Collection Time   04/23/12 11:09 AM      Component Value Range Status Comment   MRSA, PCR NEGATIVE  NEGATIVE Final    Staphylococcus aureus NEGATIVE  NEGATIVE Final     Studies/Results: No results found.  Medications: Scheduled Meds:    . cefTRIAXone (ROCEPHIN)  IV  2 g Intravenous Q24H  . citalopram  40 mg Oral Daily  . enoxaparin (LOVENOX) injection  1 mg/kg Subcutaneous Q12H  . insulin aspart  0-15 Units Subcutaneous TID WC  . insulin aspart  3 Units Subcutaneous TID WC  . insulin glargine  20 Units Subcutaneous QHS  . living well with diabetes book   Does not apply  Once  . metoprolol  50 mg Oral Daily  . pantoprazole  40 mg Oral BID AC  . senna-docusate  2 tablet Oral QHS  . sodium chloride  10 mL Intravenous Q12H  . sodium chloride  3 mL Intravenous Q12H  . traZODone  300 mg Oral QHS  . Warfarin - Pharmacist Dosing Inpatient   Does not apply q1800   Continuous Infusions:  PRN Meds:.acetaminophen, acetaminophen, morphine injection, nitroGLYCERIN, ondansetron (ZOFRAN) IV, ondansetron, oxyCODONE, polyethylene glycol, promethazine, sorbitol  Assessment/Plan: Septic shock: resolved, de-escalated abx  R knee septic arthritis: s/p knee aspiration 12/17 WBC 200K, changed abx to Vancomycin and Zosyn 12/18, synovial fluid cultures with  Group B strep, s/p  Joint irrigation and debridement per Dr.Chandler 12/18 Changed to IV Rocephin 12/20, will need this for 6 weeks, Greatly appreciate ID consult Blood cultures from 12/20 negative so far Request  PICC placement 2D Echo with normal EF and no other abnormalities   Ortho following  UTI (lower urinary tract infection):  - urine cultures negative and re-incubated  Acute kidney injury:  - This most likely secondary to septic shock and NSAIDs use. Improved, stopped IVF .  Metabolic encephalopathy:  - Multifactorial most likely secondary to sepsis: resolved   Hyponatremia:  - Is mostly secondary to decreased intravascular volume , resolved  R leg DVT: Continue Coumadin with Lovenox bridge Day 4/5 today  DM: increase lantus to 25Units, novolog SSI and with meals  Lower extremity edema: from third spacing of IVF given for sepsis resuscitation: gentle lasix today  Code Status: full  Family Communication: discussed with patient at bedside and called and talked to wife yesterday Disposition Plan: SNF soon    Time spent coordinating care:   LOS: 6 days   [facility] Pager: [phone] 04/27/2012, 9:57 AM

## 2012-04-28 LAB — BODY FLUID CULTURE

## 2012-04-28 LAB — BASIC METABOLIC PANEL
BUN: 12 mg/dL (ref 6–23)
Calcium: 8 mg/dL — ABNORMAL LOW (ref 8.4–10.5)
Creatinine, Ser: 0.79 mg/dL (ref 0.50–1.35)
GFR calc Af Amer: >90 mL/min (ref >90)
GFR calc non Af Amer: >90 mL/min (ref >90)
Glucose, Bld: 161 mg/dL — ABNORMAL HIGH (ref 70–99)
Potassium: 3.5 mEq/L (ref 3.5–5.1)

## 2012-04-28 LAB — CBC
HCT: 34.2 % — ABNORMAL LOW (ref 39.0–52.0)
Hemoglobin: 11.6 g/dL — ABNORMAL LOW (ref 13.0–17.0)
MCH: 30.6 pg (ref 26.0–34.0)
MCHC: 33.9 g/dL (ref 30.0–36.0)
RDW: 14.7 % (ref 11.5–15.5)

## 2012-04-28 LAB — GLUCOSE, CAPILLARY
Glucose-Capillary: 166 mg/dL — ABNORMAL HIGH (ref 70–99)
Glucose-Capillary: 145 mg/dL — ABNORMAL HIGH (ref 70–99)
Glucose-Capillary: 149 mg/dL — ABNORMAL HIGH (ref 70–99)

## 2012-04-28 LAB — PROTIME-INR: INR: 2.32 — ABNORMAL HIGH (ref 0.00–1.49)

## 2012-04-28 MED ORDER — CALCIUM CARBONATE ANTACID 500 MG PO CHEW
200.0000 mg | CHEWABLE_TABLET | Freq: Four times a day (QID) | ORAL | Status: DC | PRN
  Administered 2012-04-28 – 2012-05-01 (×3): 200 mg via ORAL
  Filled 2012-04-28 (×3): qty 1

## 2012-04-28 MED ORDER — WARFARIN SODIUM 3 MG PO TABS
3.0000 mg | ORAL_TABLET | Freq: Once | ORAL | Status: AC
  Administered 2012-04-28: 3 mg via ORAL
  Filled 2012-04-28: qty 1

## 2012-04-28 MED ORDER — FUROSEMIDE 40 MG PO TABS
40.0000 mg | ORAL_TABLET | Freq: Two times a day (BID) | ORAL | Status: DC
  Administered 2012-04-28 – 2012-04-30 (×4): 40 mg via ORAL
  Filled 2012-04-28 (×7): qty 1

## 2012-04-28 MED ORDER — POTASSIUM CHLORIDE CRYS ER 20 MEQ PO TBCR
40.0000 meq | EXTENDED_RELEASE_TABLET | Freq: Every day | ORAL | Status: DC
  Administered 2012-04-28 – 2012-05-02 (×5): 40 meq via ORAL
  Filled 2012-04-28: qty 2
  Filled 2012-04-28: qty 1
  Filled 2012-04-28 (×3): qty 2

## 2012-04-28 MED ORDER — SODIUM CHLORIDE 0.9 % IJ SOLN
10.0000 mL | INTRAMUSCULAR | Status: DC | PRN
  Administered 2012-04-29 – 2012-05-01 (×4): 10 mL

## 2012-04-28 NOTE — Progress Notes (Signed)
PATIENT ID: Anthony Strickland   5 Days Post-Op Procedure(s) (LRB): ARTHROSCOPY KNEE (Right)  Subjective: main complaint still R knee pain, but starting to improve in last 24 hours  Objective:  Filed Vitals:   04/28/12 0544  BP: 166/76  Pulse: 87  Temp: 98.3 F (36.8 C)  Resp: 20     R knee incs benign, no drainage I cannot appreciate and significant reaccumulation of effusion  Labs:   Basename 04/26/12 0530  HGB 11.8*   Basename 04/26/12 0530  WBC 24.2*  RBC 3.87*  HCT 36.1*  PLT 177   Basename 04/26/12 0530  NA 138  K 4.0  CL 104  CO2 26  BUN 27*  CREATININE 1.06  GLUCOSE 215*  CALCIUM 8.4    Assessment and Plan: s/p I & D R septic knee with associated large DVT Cont abx per ID, anticoag per primary team OK for WBAT, ROM as tol Will follow along.

## 2012-04-28 NOTE — Clinical Social Work Psychosocial (Signed)
Clinical Social Work Department BRIEF PSYCHOSOCIAL ASSESSMENT 04/28/2012  Patient:  Anthony Strickland,Anthony Strickland     Account Number:  [id number]     Admit date:  04/21/2012  Clinical Social Worker:  Delmer Islam  Date/Time:  04/28/2012 06:53 AM  Referred by:  Physician  Date Referred:  04/27/2012 Referred for  SNF Placement   Other Referral:   Interview type:  Patient Other interview type:   CSW also talked with patient's wife Anthony Strickland [phone])    PSYCHOSOCIAL DATA Living Status:  WIFE Admitted from facility:   Level of care:   Primary support name:  Anthony Strickland Primary support relationship to patient:  SPOUSE Degree of support available:    CURRENT CONCERNS Current Concerns  Post-Acute Placement   Other Concerns:    SOCIAL WORK ASSESSMENT / PLAN CSW talked with patient and wife who was at the bedside regarding discharge plans and the recommendation for ST rehab. Mrs. Vaquez indicated that she had already spoken with the nursing director at [facility] and she was assured that they would have a bed for patient. CSW talked with patient/wife about a 2nd choice and they were agreeable to CSW sending patient information county-wide.   Assessment/plan status:  Psychosocial Support/Ongoing Assessment of Needs Other assessment/ plan:   Information/referral to community resources:    PATIENT'S/FAMILY'S RESPONSE TO PLAN OF CARE: Patient and wife very pleasant and receptive to talking with CSW regarding placement. Per wife, she is hoping that husband can stand and pivot here at the hospital before discharge to [facility] as they don't have hoyer lifts there.

## 2012-04-28 NOTE — Progress Notes (Addendum)
[facility]             Progress Note   Subjective: C/o nausea, R knee pain starting to improve, PO intake poor, ambulation minimal  Objective: Vital signs in last 24 hours: Temp:  [97.2 F (36.2 C)-99.4 F (37.4 C)] 99.4 F (37.4 C) (12/23 0905) Pulse Rate:  [80-90] 82  (12/23 0905) Resp:  [18-20] 20  (12/23 0905) BP: (119-179)/(68-97) 166/68 mmHg (12/23 0905) SpO2:  [94 %-98 %] 96 % (12/23 0905) Weight:  [162.6 kg (358 lb 7.5 oz)] 162.6 kg (358 lb 7.5 oz) (12/22 2139) Weight change: -2.3 kg (-5 lb 1.1 oz) Last BM Date: 04/27/12  Intake/Output from previous day: 12/22 0701 - 12/23 0700 In: 480 [P.O.:480] Out: 1751 [Urine:1750; Stool:1]     Physical Exam: General: Alert, awake, oriented x3, in no acute distress. HEENT: No bruits, no goiter. Heart: Regular rate and rhythm, without murmurs, rubs, gallops. Lungs: Clear to auscultation bilaterally, distant BS at bases Abdomen: Soft, nontender, nondistended, positive bowel sounds. Extremities: R knee swollen, RLE edema 2 plus, LLE 1 plus edema with positive pedal pulses. Neuro: Grossly intact, nonfocal.    Lab Results: Basic Metabolic Panel:  Basename 04/26/12 0530  NA 138  K 4.0  CL 104  CO2 26  GLUCOSE 215*  BUN 27*  CREATININE 1.06  CALCIUM 8.4  MG --  PHOS --   Liver Function Tests: No results found for this basename: AST:2,ALT:2,ALKPHOS:2,BILITOT:2,PROT:2,ALBUMIN:2 in the last 72 hours No results found for this basename: LIPASE:2,AMYLASE:2 in the last 72 hours No results found for this basename: AMMONIA:2 in the last 72 hours CBC:  Basename 04/26/12 0530  WBC 24.2*  NEUTROABS --  HGB 11.8*  HCT 36.1*  MCV 93.3  PLT 177   Cardiac Enzymes: No results found for this basename: CKTOTAL:3,CKMB:3,CKMBINDEX:3,TROPONINI:3 in the last 72 hours BNP: No results found for this basename: PROBNP:3 in the last 72 hours D-Dimer: No results found for this basename: DDIMER:2 in the last 72  hours CBG:  Basename 04/28/12 0733 04/27/12 2134 04/27/12 1721 04/27/12 1118 04/27/12 0737 04/26/12 2131  GLUCAP 166* 149* 176* 228* 191* 170*   Hemoglobin A1C: No results found for this basename: HGBA1C in the last 72 hours Fasting Lipid Panel: No results found for this basename: CHOL,HDL,LDLCALC,TRIG,CHOLHDL,LDLDIRECT in the last 72 hours Thyroid Function Tests: No results found for this basename: TSH,T4TOTAL,FREET4,T3FREE,THYROIDAB in the last 72 hours Anemia Panel: No results found for this basename: VITAMINB12,FOLATE,FERRITIN,TIBC,IRON,RETICCTPCT in the last 72 hours Coagulation:  Basename 04/27/12 1611 04/26/12 0530  LABPROT 27.8* 19.9*  INR 2.76* 1.76*   Urine Drug Screen: Drugs of Abuse     Component Value Date/Time   LABOPIA NONE DETECTED 04/21/2012 1646   COCAINSCRNUR NONE DETECTED 04/21/2012 1646   LABBENZ NONE DETECTED 04/21/2012 1646   AMPHETMU NONE DETECTED 04/21/2012 1646   THCU NONE DETECTED 04/21/2012 1646   LABBARB NONE DETECTED 04/21/2012 1646    Alcohol Level: No results found for this basename: ETH:2 in the last 72 hours Urinalysis: No results found for this basename: COLORURINE:2,APPERANCEUR:2,LABSPEC:2,PHURINE:2,GLUCOSEU:2,HGBUR:2,BILIRUBINUR:2,KETONESUR:2,PROTEINUR:2,UROBILINOGEN:2,NITRITE:2,LEUKOCYTESUR:2 in the last 72 hours Recent Results (from the past 240 hour(s))  URINE CULTURE     Status: Normal   Collection Time   04/21/12  4:46 PM      Component Value Range Status Comment   Specimen Description URINE, CLEAN CATCH   Final    Special Requests NONE   Final    Culture  Setup Time 04/21/2012 22:45   Final    Colony  Count 35,000 COLONIES/ML   Final    Culture     Final    Value: GROUP B STREP(S.AGALACTIAE)ISOLATED     Note: TESTING AGAINST S. AGALACTIAE NOT ROUTINELY PERFORMED DUE TO PREDICTABILITY OF AMP/PEN/VAN SUSCEPTIBILITY.   Report Status 04/24/2012 FINAL   Final   URINE CULTURE     Status: Normal   Collection Time   04/22/12  5:10 AM       Component Value Range Status Comment   Specimen Description URINE, CATHETERIZED   Final    Special Requests NONE   Final    Culture  Setup Time 04/22/2012 09:26   Final    Colony Count NO GROWTH   Final    Culture NO GROWTH   Final    Report Status 04/23/2012 FINAL   Final   MRSA PCR SCREENING     Status: Normal   Collection Time   04/22/12  8:03 AM      Component Value Range Status Comment   MRSA by PCR NEGATIVE  NEGATIVE Final   BODY FLUID CULTURE     Status: Normal   Collection Time   04/22/12 10:52 PM      Component Value Range Status Comment   Specimen Description SYNOVIAL RIGHT KNEE   Final    Special Requests NONE   Final    Gram Stain     Final    Value: ABUNDANT WBC PRESENT, PREDOMINANTLY PMN     MODERATE GRAM POSITIVE COCCI     IN PAIRS IN CHAINS Gram Stain Report Called to,Read Back By and Verified With: Gram Stain Report Called to,Read Back By and Verified With: CAROLYN WHITE 04/23/12 10:43AM BY MILSH   Culture     Final    Value: ABUNDANT GROUP B STREP(S.AGALACTIAE)ISOLATED     Note: TESTING AGAINST S. AGALACTIAE NOT ROUTINELY PERFORMED DUE TO PREDICTABILITY OF AMP/PEN/VAN SUSCEPTIBILITY.   Report Status 04/24/2012 FINAL   Final    Organism ID, Bacteria GROUP B STREP(S.AGALACTIAE)ISOLATED   Final   SURGICAL PCR SCREEN     Status: Normal   Collection Time   04/23/12 11:09 AM      Component Value Range Status Comment   MRSA, PCR NEGATIVE  NEGATIVE Final    Staphylococcus aureus NEGATIVE  NEGATIVE Final   CULTURE, BLOOD (ROUTINE X 2)     Status: Normal (Preliminary result)   Collection Time   04/26/12 12:30 PM      Component Value Range Status Comment   Specimen Description BLOOD LEFT ARM   Final    Special Requests BOTTLES DRAWN AEROBIC ONLY 10CC   Final    Culture  Setup Time 04/26/2012 20:04   Final    Culture     Final    Value:        BLOOD CULTURE RECEIVED NO GROWTH TO DATE CULTURE WILL BE HELD FOR 5 DAYS BEFORE ISSUING A FINAL NEGATIVE REPORT   Report  Status PENDING   Incomplete   CULTURE, BLOOD (ROUTINE X 2)     Status: Normal (Preliminary result)   Collection Time   04/26/12 12:40 PM      Component Value Range Status Comment   Specimen Description BLOOD RIGHT ARM   Final    Special Requests BOTTLES DRAWN AEROBIC ONLY 8CC   Final    Culture  Setup Time 04/26/2012 20:05   Final    Culture     Final    Value:        BLOOD CULTURE  RECEIVED NO GROWTH TO DATE CULTURE WILL BE HELD FOR 5 DAYS BEFORE ISSUING A FINAL NEGATIVE REPORT   Report Status PENDING   Incomplete     Studies/Results: No results found.  Medications: Scheduled Meds:    . cefTRIAXone (ROCEPHIN)  IV  2 g Intravenous Q24H  . citalopram  40 mg Oral Daily  . enoxaparin (LOVENOX) injection  1 mg/kg Subcutaneous Q12H  . furosemide  40 mg Oral BID  . insulin aspart  0-15 Units Subcutaneous TID WC  . insulin aspart  3 Units Subcutaneous TID WC  . insulin glargine  25 Units Subcutaneous QHS  . living well with diabetes book   Does not apply Once  . metoprolol  50 mg Oral Daily  . pantoprazole  40 mg Oral BID AC  . potassium chloride  40 mEq Oral Daily  . senna-docusate  2 tablet Oral QHS  . sodium chloride  10 mL Intravenous Q12H  . sodium chloride  3 mL Intravenous Q12H  . traZODone  300 mg Oral QHS  . Warfarin - Pharmacist Dosing Inpatient   Does not apply q1800   Continuous Infusions:  PRN Meds:.acetaminophen, acetaminophen, morphine injection, nitroGLYCERIN, ondansetron (ZOFRAN) IV, ondansetron, oxyCODONE, polyethylene glycol, promethazine, sorbitol  Assessment/Plan: Septic shock: resolved, de-escalated abx  R knee septic arthritis: s/p knee aspiration 12/17 WBC 200K, changed abx to Vancomycin and Zosyn 12/18, synovial fluid cultures with Group B strep, s/p  Joint irrigation and debridement per Dr.Chandler 12/18 Changed to IV Rocephin 12/20, will need this for 6 weeks, Greatly appreciate ID consult Blood cultures from 12/20 negative so far and 12/21 negative so  far PICC placement 2D Echo with normal EF and no other abnormalities   Ortho following  UTI (lower urinary tract infection):  - urine cultures negative and re-incubated  Acute kidney injury:  - This most likely secondary to septic shock and NSAIDs use. Improved, stopped IVF .  Metabolic encephalopathy:  - Multifactorial most likely secondary to sepsis: resolved   Hyponatremia:  - Is mostly secondary to decreased intravascular volume , resolved  R leg DVT: Continue Coumadin with Lovenox bridge Day 5/5 today, DC lovenox after todays dose  DM: continue lantus to 25Units, novolog SSI and with meals  Lower extremity edema: from third spacing of IVF given for sepsis resuscitation: continue lasix  Code Status: full  Family Communication: discussed with patient at bedside and called and talked to wife yesterday Disposition Plan: SNF soon, CSW consult    Time spent coordinating care:   LOS: 7 days   [facility] Pager: [phone] 04/28/2012, 9:35 AM

## 2012-04-28 NOTE — Progress Notes (Addendum)
ANTICOAGULATION CONSULT NOTE - Follow Up Consult  Pharmacy Consult for Lovenox and Coumadin Indication: DVT  No Known Allergies  Patient Measurements: Height: 5' 8.5" (174 cm) Weight: 358 lb 7.5 oz (162.6 kg) IBW/kg (Calculated) : 69.55  Heparin Dosing Weight:   Vital Signs: Temp: 99.4 F (37.4 C) (12/23 0905) Temp src: Oral (12/23 0905) BP: 166/68 mmHg (12/23 0905) Pulse Rate: 82  (12/23 0905)  Labs:  Basename 04/28/12 1050 04/27/12 1611 04/26/12 0530  HGB 11.6* -- 11.8*  HCT 34.2* -- 36.1*  PLT 231 -- 177  APTT -- -- --  LABPROT 24.4* 27.8* 19.9*  INR 2.32* 2.76* 1.76*  HEPARINUNFRC -- -- --  CREATININE 0.79 -- 1.06  CKTOTAL -- -- --  CKMB -- -- --  TROPONINI -- -- --    Estimated Creatinine Clearance: 139.1 ml/min (by C-G formula based on Cr of 0.79).   Medications:  Lovenox 165mg  SQ q12h  Assessment: 66yom on Lovenox bridging to Coumadin (Day 5 of minimum 5 day overlap) for acute DVT. INR (2.32) remains therapeutic and trended down with Coumadin 1mg  dose. INR has been sporadic during initial dosing - will order Coumadin 3mg  tonight and continue Lovenox through today to complete overlap and ensure INR remains therapeutic. - H/H and Plts stable - No significant bleeding reported  Goal of Therapy:  INR 2-3 Anti-Xa level 0.6-1.2 units/ml 4hrs after LMWH dose given Monitor platelets by anticoagulation protocol: Yes   Plan:  1. Coumadin 3mg  po x 1 today 2. Continue Lovenox 165mg  SQ q12h - will plan to discontinue in AM if INR remains therapeutic 3. Follow-up AM INR and CBC 4. Patient's blood pressure beginning to elevate - consider restarting home meds of benazepril and/or terazosin  Cleon Dew [phone] 04/28/2012,12:05 PM

## 2012-04-28 NOTE — Progress Notes (Signed)
Patient does not want to wear cpap at this time. 

## 2012-04-28 NOTE — Clinical Social Work Placement (Addendum)
Clinical Social Work Department CLINICAL SOCIAL WORK PLACEMENT NOTE 04/28/2012  Patient:  Anthony Strickland, Anthony Strickland  Account Number:  [id number] Admit date:  04/21/2012  Clinical Social Worker:  Genelle Bal, LCSW  Date/time:  04/28/2012 07:07 AM  Clinical Social Work is seeking post-discharge placement for this patient at the following level of care:      (*CSW will update this form in Epic as items are completed)     Patient/family provided with [facility] Department of Clinical Social Work's list of facilities offering this level of care within the geographic area requested by the patient (or if unable, by the patient's family).  04/28/2012  Patient/family informed of their freedom to choose among providers that offer the needed level of care, that participate in Medicare, Medicaid or managed care program needed by the patient, have an available bed and are willing to accept the patient.    Patient/family informed of [facility]' ownership interest in [facility], as well as of the fact that they are under no obligation to receive care at this facility.  PASARR submitted to [employer] on 04/28/2012 PASARR number received from [employer] on   FL2 transmitted to all facilities in geographic area requested by pt/family on  04/28/2012 FL2 transmitted to all facilities within larger geographic area on   Patient informed that his/her managed care company has contracts with or will negotiate with  certain facilities, including the following:     Patient/family informed of bed offers received: 04/29/12  Patient chooses bed at [facility] Physician recommends and patient chooses bed at    Patient to be transferred to [facility] on 05/02/12   Patient to be transferred to facility by ambulance  The following physician request were entered in Epic:  Additional Comments: 04/29/12 - Husband given offers. CSW talked with admissions staff at [facility] and they do have hoyer lifts and husband  advised of this. 04/28/12 - Patient/wife preference is [facility]. 05/01/12 - Have not received PASARR . MUST office closed through 12/26.  05/02/12 - Received PASARR number. D/C information forwarded to SNF. Wife contacted and is aware of today's discharge.

## 2012-04-28 NOTE — Progress Notes (Signed)
MD was paged to clarify if she still wanted the pt to receive a PICC line prior to final blood culture results and she stated she did because the first sets were negative so far. IV team was called and made aware.

## 2012-04-28 NOTE — Progress Notes (Signed)
[facility]             Progress Note   Subjective: C/o nausea, R knee pain starting to improve, PO intake poor, ambulation minimal  Objective: Vital signs in last 24 hours: Temp:  [97.6 F (36.4 C)-99.4 F (37.4 C)] 98.1 F (36.7 C) (12/23 1301) Pulse Rate:  [80-87] 87  (12/23 1301) Resp:  [18-20] 20  (12/23 1301) BP: (119-179)/(68-97) 159/83 mmHg (12/23 1301) SpO2:  [94 %-98 %] 94 % (12/23 1301) Weight:  [162.6 kg (358 lb 7.5 oz)] 162.6 kg (358 lb 7.5 oz) (12/22 2139) Weight change: -2.3 kg (-5 lb 1.1 oz) Last BM Date: 04/27/12  Intake/Output from previous day: 12/22 0701 - 12/23 0700 In: 480 [P.O.:480] Out: 1751 [Urine:1750; Stool:1]     Physical Exam: General: Alert, awake, oriented x3, in no acute distress. HEENT: No bruits, no goiter. Heart: Regular rate and rhythm, without murmurs, rubs, gallops. Lungs: Clear to auscultation bilaterally, distant BS at bases Abdomen: Soft, nontender, nondistended, positive bowel sounds. Extremities: R knee swollen, RLE edema 2 plus, LLE 1 plus edema with positive pedal pulses. Neuro: Grossly intact, nonfocal.    Lab Results: Basic Metabolic Panel:  Basename 04/28/12 1050 04/26/12 0530  NA 136 138  K 3.5 4.0  CL 102 104  CO2 25 26  GLUCOSE 161* 215*  BUN 12 27*  CREATININE 0.79 1.06  CALCIUM 8.0* 8.4  MG -- --  PHOS -- --   Liver Function Tests: No results found for this basename: AST:2,ALT:2,ALKPHOS:2,BILITOT:2,PROT:2,ALBUMIN:2 in the last 72 hours No results found for this basename: LIPASE:2,AMYLASE:2 in the last 72 hours No results found for this basename: AMMONIA:2 in the last 72 hours CBC:  Basename 04/28/12 1050 04/26/12 0530  WBC 26.7* 24.2*  NEUTROABS -- --  HGB 11.6* 11.8*  HCT 34.2* 36.1*  MCV 90.2 93.3  PLT 231 177   Cardiac Enzymes: No results found for this basename: CKTOTAL:3,CKMB:3,CKMBINDEX:3,TROPONINI:3 in the last 72 hours BNP: No results found for this basename: PROBNP:3 in  the last 72 hours D-Dimer: No results found for this basename: DDIMER:2 in the last 72 hours CBG:  Basename 04/28/12 1113 04/28/12 0733 04/27/12 2134 04/27/12 1721 04/27/12 1118 04/27/12 0737  GLUCAP 145* 166* 149* 176* 228* 191*   Hemoglobin A1C: No results found for this basename: HGBA1C in the last 72 hours Fasting Lipid Panel: No results found for this basename: CHOL,HDL,LDLCALC,TRIG,CHOLHDL,LDLDIRECT in the last 72 hours Thyroid Function Tests: No results found for this basename: TSH,T4TOTAL,FREET4,T3FREE,THYROIDAB in the last 72 hours Anemia Panel: No results found for this basename: VITAMINB12,FOLATE,FERRITIN,TIBC,IRON,RETICCTPCT in the last 72 hours Coagulation:  Basename 04/28/12 1050 04/27/12 1611  LABPROT 24.4* 27.8*  INR 2.32* 2.76*   Urine Drug Screen: Drugs of Abuse     Component Value Date/Time   LABOPIA NONE DETECTED 04/21/2012 1646   COCAINSCRNUR NONE DETECTED 04/21/2012 1646   LABBENZ NONE DETECTED 04/21/2012 1646   AMPHETMU NONE DETECTED 04/21/2012 1646   THCU NONE DETECTED 04/21/2012 1646   LABBARB NONE DETECTED 04/21/2012 1646    Alcohol Level: No results found for this basename: ETH:2 in the last 72 hours Urinalysis: No results found for this basename: COLORURINE:2,APPERANCEUR:2,LABSPEC:2,PHURINE:2,GLUCOSEU:2,HGBUR:2,BILIRUBINUR:2,KETONESUR:2,PROTEINUR:2,UROBILINOGEN:2,NITRITE:2,LEUKOCYTESUR:2 in the last 72 hours Recent Results (from the past 240 hour(s))  URINE CULTURE     Status: Normal   Collection Time   04/21/12  4:46 PM      Component Value Range Status Comment   Specimen Description URINE, CLEAN CATCH   Final    Special Requests  NONE   Final    Culture  Setup Time 04/21/2012 22:45   Final    Colony Count 35,000 COLONIES/ML   Final    Culture     Final    Value: GROUP B STREP(S.AGALACTIAE)ISOLATED     Note: TESTING AGAINST S. AGALACTIAE NOT ROUTINELY PERFORMED DUE TO PREDICTABILITY OF AMP/PEN/VAN SUSCEPTIBILITY.   Report Status 04/24/2012  FINAL   Final   URINE CULTURE     Status: Normal   Collection Time   04/22/12  5:10 AM      Component Value Range Status Comment   Specimen Description URINE, CATHETERIZED   Final    Special Requests NONE   Final    Culture  Setup Time 04/22/2012 09:26   Final    Colony Count NO GROWTH   Final    Culture NO GROWTH   Final    Report Status 04/23/2012 FINAL   Final   MRSA PCR SCREENING     Status: Normal   Collection Time   04/22/12  8:03 AM      Component Value Range Status Comment   MRSA by PCR NEGATIVE  NEGATIVE Final   BODY FLUID CULTURE     Status: Normal   Collection Time   04/22/12 10:52 PM      Component Value Range Status Comment   Specimen Description SYNOVIAL RIGHT KNEE   Final    Special Requests NONE   Final    Gram Stain     Final    Value: ABUNDANT WBC PRESENT, PREDOMINANTLY PMN     MODERATE GRAM POSITIVE COCCI     IN PAIRS IN CHAINS Gram Stain Report Called to,Read Back By and Verified With: Gram Stain Report Called to,Read Back By and Verified With: CAROLYN WHITE 04/23/12 10:43AM BY MILSH   Culture     Final    Value: ABUNDANT GROUP B STREP(S.AGALACTIAE)ISOLATED     Note: TESTING AGAINST S. AGALACTIAE NOT ROUTINELY PERFORMED DUE TO PREDICTABILITY OF AMP/PEN/VAN SUSCEPTIBILITY.   Report Status 04/24/2012 FINAL   Final    Organism ID, Bacteria GROUP B STREP(S.AGALACTIAE)ISOLATED   Final   SURGICAL PCR SCREEN     Status: Normal   Collection Time   04/23/12 11:09 AM      Component Value Range Status Comment   MRSA, PCR NEGATIVE  NEGATIVE Final    Staphylococcus aureus NEGATIVE  NEGATIVE Final   CULTURE, BLOOD (ROUTINE X 2)     Status: Normal (Preliminary result)   Collection Time   04/26/12 12:30 PM      Component Value Range Status Comment   Specimen Description BLOOD LEFT ARM   Final    Special Requests BOTTLES DRAWN AEROBIC ONLY 10CC   Final    Culture  Setup Time 04/26/2012 20:04   Final    Culture     Final    Value:        BLOOD CULTURE RECEIVED NO GROWTH  TO DATE CULTURE WILL BE HELD FOR 5 DAYS BEFORE ISSUING A FINAL NEGATIVE REPORT   Report Status PENDING   Incomplete   CULTURE, BLOOD (ROUTINE X 2)     Status: Normal (Preliminary result)   Collection Time   04/26/12 12:40 PM      Component Value Range Status Comment   Specimen Description BLOOD RIGHT ARM   Final    Special Requests BOTTLES DRAWN AEROBIC ONLY 8CC   Final    Culture  Setup Time 04/26/2012 20:05   Final  Culture     Final    Value:        BLOOD CULTURE RECEIVED NO GROWTH TO DATE CULTURE WILL BE HELD FOR 5 DAYS BEFORE ISSUING A FINAL NEGATIVE REPORT   Report Status PENDING   Incomplete     Studies/Results: No results found.  Medications: Scheduled Meds:    . cefTRIAXone (ROCEPHIN)  IV  2 g Intravenous Q24H  . citalopram  40 mg Oral Daily  . enoxaparin (LOVENOX) injection  1 mg/kg Subcutaneous Q12H  . furosemide  40 mg Oral BID  . insulin aspart  0-15 Units Subcutaneous TID WC  . insulin aspart  3 Units Subcutaneous TID WC  . insulin glargine  25 Units Subcutaneous QHS  . living well with diabetes book   Does not apply Once  . metoprolol  50 mg Oral Daily  . pantoprazole  40 mg Oral BID AC  . potassium chloride  40 mEq Oral Daily  . senna-docusate  2 tablet Oral QHS  . sodium chloride  10 mL Intravenous Q12H  . sodium chloride  3 mL Intravenous Q12H  . traZODone  300 mg Oral QHS  . warfarin  3 mg Oral ONCE-1800  . Warfarin - Pharmacist Dosing Inpatient   Does not apply q1800   Continuous Infusions:  PRN Meds:.acetaminophen, acetaminophen, morphine injection, nitroGLYCERIN, ondansetron (ZOFRAN) IV, ondansetron, oxyCODONE, polyethylene glycol, promethazine, sodium chloride, sorbitol  Assessment/Plan: Septic shock: resolved, de-escalated abx  R knee septic arthritis: s/p knee aspiration 12/17 WBC 200K, changed abx to Vancomycin and Zosyn 12/18, synovial fluid cultures with Group B strep, s/p  Joint irrigation and debridement per Dr.Chandler 12/18 Changed to IV  Rocephin 12/20, will need this for 6 weeks, Greatly appreciate ID consult Blood cultures from 12/20 negative so far and 12/21 negative so far PICC placement 2D Echo with normal EF and no other abnormalities   Ortho following  UTI (lower urinary tract infection):  - urine cultures negative and re-incubated  Acute kidney injury:  - This most likely secondary to septic shock and NSAIDs use. Improved, stopped IVF .  Metabolic encephalopathy:  - Multifactorial most likely secondary to sepsis: resolved   Hyponatremia:  - Is mostly secondary to decreased intravascular volume , resolved  R leg DVT: Continue Coumadin with Lovenox bridge Day 5/5 today, DC lovenox after todays dose  DM: continue lantus to 25Units, novolog SSI and with meals  Lower extremity edema: from third spacing of IVF given for sepsis resuscitation: continue lasix  Code Status: full  Family Communication: discussed with patient at bedside and called and talked to wife yesterday Disposition Plan: SNF soon, CSW consult    Time spent coordinating care:   LOS: 7 days   [facility] Pager: [phone] 04/28/2012, 2:20 PM

## 2012-04-28 NOTE — Progress Notes (Signed)
[facility]    Subjective: No new complaints  Antibiotics:  Anti-infectives     Start     Dose/Rate Route Frequency Ordered Stop   04/25/12 1200   cefTRIAXone (ROCEPHIN) 2 g in dextrose 5 % 50 mL IVPB        2 g 100 mL/hr over 30 Minutes Intravenous Every 24 hours 04/25/12 1122     04/23/12 1645   tobramycin (NEBCIN) 80 mg in sodium chloride 0.9 % 50 mL IVPB  Status:  Discontinued        80 mg 104 mL/hr over 30 Minutes Intravenous  Once 04/23/12 1634 04/23/12 1825   04/23/12 0930   vancomycin (VANCOCIN) 2,000 mg in sodium chloride 0.9 % 500 mL IVPB  Status:  Discontinued        2,000 mg 250 mL/hr over 120 Minutes Intravenous Every 24 hours 04/23/12 0831 04/25/12 1122   04/23/12 0900   piperacillin-tazobactam (ZOSYN) IVPB 3.375 g  Status:  Discontinued        3.375 g 12.5 mL/hr over 240 Minutes Intravenous Every 8 hours 04/23/12 0830 04/25/12 1122   04/22/12 1800   cefTRIAXone (ROCEPHIN) 1 g in dextrose 5 % 50 mL IVPB  Status:  Discontinued        1 g 100 mL/hr over 30 Minutes Intravenous Every 24 hours 04/22/12 0422 04/23/12 0737   04/21/12 1730   cefTRIAXone (ROCEPHIN) 1 g in dextrose 5 % 50 mL IVPB        1 g 100 mL/hr over 30 Minutes Intravenous  Once 04/21/12 1727 04/21/12 1846          Medications: Scheduled Meds:   . cefTRIAXone (ROCEPHIN)  IV  2 g Intravenous Q24H  . citalopram  40 mg Oral Daily  . enoxaparin (LOVENOX) injection  1 mg/kg Subcutaneous Q12H  . furosemide  40 mg Oral BID  . insulin aspart  0-15 Units Subcutaneous TID WC  . insulin aspart  3 Units Subcutaneous TID WC  . insulin glargine  25 Units Subcutaneous QHS  . living well with diabetes book   Does not apply Once  . metoprolol  50 mg Oral Daily  . pantoprazole  40 mg Oral BID AC  . potassium chloride  40 mEq Oral Daily  . senna-docusate  2 tablet Oral QHS  . sodium chloride  10 mL Intravenous Q12H  . sodium chloride  3 mL Intravenous Q12H  . traZODone  300  mg Oral QHS  . warfarin  3 mg Oral ONCE-1800  . Warfarin - Pharmacist Dosing Inpatient   Does not apply q1800   Continuous Infusions:  PRN Meds:.acetaminophen, acetaminophen, morphine injection, nitroGLYCERIN, ondansetron (ZOFRAN) IV, ondansetron, oxyCODONE, polyethylene glycol, promethazine, sodium chloride, sorbitol   Objective: Weight change: -5 lb 1.1 oz (-2.3 kg)  Intake/Output Summary (Last 24 hours) at 04/28/12 1351 Last data filed at 04/28/12 4098  Gross per 24 hour  Intake    240 ml  Output   1750 ml  Net  -1510 ml   Blood pressure 159/83, pulse 87, temperature 98.1 F (36.7 C), temperature source Oral, resp. rate 20, height 5' 8.5" (1.74 m), weight 358 lb 7.5 oz (162.6 kg), SpO2 94.00%. Temp:  [97.6 F (36.4 C)-99.4 F (37.4 C)] 98.1 F (36.7 C) (12/23 1301) Pulse Rate:  [80-87] 87  (12/23 1301) Resp:  [18-20] 20  (12/23 1301) BP: (119-179)/(68-97) 159/83 mmHg (12/23 1301) SpO2:  [94 %-98 %] 94 % (12/23 1301) Weight:  [  358 lb 7.5 oz (162.6 kg)] 358 lb 7.5 oz (162.6 kg) (12/22 2139)  Physical Exam: General: Alert and awake, oriented x3, not in any acute distress. Morbidly obese  HEENT: anicteric sclera, pupils reactive to light and accommodation, EOMI, oropharynx clear and without exudate  CVS regular rate, normal r, no murmur rubs or gallops  Chest: clear to auscultation bilaterally, no wheezing, rales or rhonchi  Abdomen: soft complaining of tenderness in his epigastrium nondistended, normal bowel sounds,  Extremities: Right knee wrapped in dressing  Skin: no rashes  Neuro: nonfocal, strength and sensation intact   Lab Results:  Basename 04/28/12 1050 04/26/12 0530  WBC 26.7* 24.2*  HGB 11.6* 11.8*  HCT 34.2* 36.1*  PLT 231 177    BMET  Basename 04/28/12 1050 04/26/12 0530  NA 136 138  K 3.5 4.0  CL 102 104  CO2 25 26  GLUCOSE 161* 215*  BUN 12 27*  CREATININE 0.79 1.06  CALCIUM 8.0* 8.4    Micro Results: Recent Results (from the past 240  hour(s))  URINE CULTURE     Status: Normal   Collection Time   04/21/12  4:46 PM      Component Value Range Status Comment   Specimen Description URINE, CLEAN CATCH   Final    Special Requests NONE   Final    Culture  Setup Time 04/21/2012 22:45   Final    Colony Count 35,000 COLONIES/ML   Final    Culture     Final    Value: GROUP B STREP(S.AGALACTIAE)ISOLATED     Note: TESTING AGAINST S. AGALACTIAE NOT ROUTINELY PERFORMED DUE TO PREDICTABILITY OF AMP/PEN/VAN SUSCEPTIBILITY.   Report Status 04/24/2012 FINAL   Final   URINE CULTURE     Status: Normal   Collection Time   04/22/12  5:10 AM      Component Value Range Status Comment   Specimen Description URINE, CATHETERIZED   Final    Special Requests NONE   Final    Culture  Setup Time 04/22/2012 09:26   Final    Colony Count NO GROWTH   Final    Culture NO GROWTH   Final    Report Status 04/23/2012 FINAL   Final   MRSA PCR SCREENING     Status: Normal   Collection Time   04/22/12  8:03 AM      Component Value Range Status Comment   MRSA by PCR NEGATIVE  NEGATIVE Final   BODY FLUID CULTURE     Status: Normal   Collection Time   04/22/12 10:52 PM      Component Value Range Status Comment   Specimen Description SYNOVIAL RIGHT KNEE   Final    Special Requests NONE   Final    Gram Stain     Final    Value: ABUNDANT WBC PRESENT, PREDOMINANTLY PMN     MODERATE GRAM POSITIVE COCCI     IN PAIRS IN CHAINS Gram Stain Report Called to,Read Back By and Verified With: Gram Stain Report Called to,Read Back By and Verified With: CAROLYN WHITE 04/23/12 10:43AM BY MILSH   Culture     Final    Value: ABUNDANT GROUP B STREP(S.AGALACTIAE)ISOLATED     Note: TESTING AGAINST S. AGALACTIAE NOT ROUTINELY PERFORMED DUE TO PREDICTABILITY OF AMP/PEN/VAN SUSCEPTIBILITY.   Report Status 04/24/2012 FINAL   Final    Organism ID, Bacteria GROUP B STREP(S.AGALACTIAE)ISOLATED   Final   SURGICAL PCR SCREEN     Status: Normal  Collection Time   04/23/12 11:09  AM      Component Value Range Status Comment   MRSA, PCR NEGATIVE  NEGATIVE Final    Staphylococcus aureus NEGATIVE  NEGATIVE Final   CULTURE, BLOOD (ROUTINE X 2)     Status: Normal (Preliminary result)   Collection Time   04/26/12 12:30 PM      Component Value Range Status Comment   Specimen Description BLOOD LEFT ARM   Final    Special Requests BOTTLES DRAWN AEROBIC ONLY 10CC   Final    Culture  Setup Time 04/26/2012 20:04   Final    Culture     Final    Value:        BLOOD CULTURE RECEIVED NO GROWTH TO DATE CULTURE WILL BE HELD FOR 5 DAYS BEFORE ISSUING A FINAL NEGATIVE REPORT   Report Status PENDING   Incomplete   CULTURE, BLOOD (ROUTINE X 2)     Status: Normal (Preliminary result)   Collection Time   04/26/12 12:40 PM      Component Value Range Status Comment   Specimen Description BLOOD RIGHT ARM   Final    Special Requests BOTTLES DRAWN AEROBIC ONLY 8CC   Final    Culture  Setup Time 04/26/2012 20:05   Final    Culture     Final    Value:        BLOOD CULTURE RECEIVED NO GROWTH TO DATE CULTURE WILL BE HELD FOR 5 DAYS BEFORE ISSUING A FINAL NEGATIVE REPORT   Report Status PENDING   Incomplete     Studies/Results: No results found.    Assessment/Plan: Anthony Strickland is a 66 y.o. male withgroup B strep SEpic arthritis and highly likely concommittant GBS bacteremia and septic shock   #1 GBS Septic arthritis, likely bacteremia  central line is out -repeat cultures on 04/26/12 NGTD --PICC placed on 04/28/12 2 echo without vegetations  ONE could  consider TEE to more closely look for endocarditis but if this is too risky, then I am OK to hold off on TEE since we will treat him with IV abx for 6 weeks regardless   --He should get IV rocephin for 6 weeks with STOP DATE being 06/08/11 --he should have weekly cbc, bmp faxed to my office at [phone]  --I will arrange HSFU with me prior to his stopping abx   #2 screening; HIV negative  Please call with further questions.    LOS: 7 days   Acey Lav 04/28/2012, 1:51 PM

## 2012-04-28 NOTE — Progress Notes (Signed)
Physical Therapy Treatment Patient Details Name: Anthony Strickland MRN: [id number] DOB: 02/14/1946 Today's Date: 04/28/2012 Time: 4098-1191 PT Time Calculation (min): 23 min  PT Assessment / Plan / Recommendation Comments on Treatment Session  Pt adm with infected rt knee and underwent I&D.  Pt with very slow progress and unable to stand.    Follow Up Recommendations  SNF     Does the patient have the potential to tolerate intense rehabilitation     Barriers to Discharge        Equipment Recommendations  Wheelchair (measurements PT) (bariatric)    Recommendations for Other Services    Frequency Min 3X/week   Plan Discharge plan remains appropriate;Frequency remains appropriate    Precautions / Restrictions Precautions Precautions: Fall   Pertinent Vitals/Pain Rt knee pain.  Premedicated.    Mobility  Bed Mobility Bed Mobility: Supine to Sit;Sitting - Scoot to [place] of Bed;Sit to Supine Supine to Sit: 1: +2 Total assist;HOB elevated Supine to Sit: Patient Percentage: 40% Sitting - Scoot to Edge of Bed: 1: +2 Total assist Sitting - Scoot to Edge of Bed: Patient Percentage: 50% Sit to Supine: 1: +2 Total assist;HOB flat Sit to Supine: Patient Percentage: 20% Scooting to HOB: 1: +2 Total assist Scooting to [facility]: Patient Percentage: 70% Details for Bed Mobility Assistance: Assist to bring trunk up and to move legs to come to sitting. Assist to lower trunk and bring legs up to return to supine. Transfers Details for Transfer Assistance: Attempted sit to stand but pt unable  to clear hips from bed.    Exercises General Exercises - Lower Extremity Ankle Circles/Pumps: Right;5 reps;Supine Quad Sets: Right;5 reps;Supine   PT Diagnosis:    PT Problem List:   PT Treatment Interventions:     PT Goals Acute Rehab PT Goals PT Goal: Supine/Side to Sit - Progress: Progressing toward goal PT Goal: Sit to Supine/Side - Progress: Progressing toward goal PT Goal: Sit to Stand -  Progress: Progressing toward goal PT Goal: Stand to Sit - Progress: Progressing toward goal  Visit Information  Last PT Received On: 04/28/12 Assistance Needed: +2    Subjective Data  Subjective: Pt not sure he can stand.   Cognition  Overall Cognitive Status: Appears within functional limits for tasks assessed/performed Arousal/Alertness: Awake/alert Orientation Level: Appears intact for tasks assessed Behavior During Session: [facility] for tasks performed    Balance     End of Session PT - End of Session Activity Tolerance: Patient limited by pain;Patient limited by fatigue Patient left: in bed;with call bell/phone within reach;with family/visitor present   GP     [facility] 04/28/2012, 12:39 PM  [facility] PT [phone]

## 2012-04-29 LAB — PROTIME-INR
INR: 2.18 — ABNORMAL HIGH (ref 0.00–1.49)
Prothrombin Time: 23.3 seconds — ABNORMAL HIGH (ref 11.6–15.2)

## 2012-04-29 LAB — BASIC METABOLIC PANEL
BUN: 10 mg/dL (ref 6–23)
Calcium: 7.9 mg/dL — ABNORMAL LOW (ref 8.4–10.5)
Chloride: 101 mEq/L (ref 96–112)
Creatinine, Ser: 0.83 mg/dL (ref 0.50–1.35)
GFR calc Af Amer: >90 mL/min (ref >90)

## 2012-04-29 LAB — GLUCOSE, CAPILLARY
Glucose-Capillary: 184 mg/dL — ABNORMAL HIGH (ref 70–99)
Glucose-Capillary: 143 mg/dL — ABNORMAL HIGH (ref 70–99)
Glucose-Capillary: 143 mg/dL — ABNORMAL HIGH (ref 70–99)

## 2012-04-29 MED ORDER — ADULT MULTIVITAMIN W/MINERALS CH
1.0000 | ORAL_TABLET | Freq: Every day | ORAL | Status: DC
  Administered 2012-04-29 – 2012-05-02 (×4): 1 via ORAL
  Filled 2012-04-29 (×4): qty 1

## 2012-04-29 MED ORDER — WARFARIN SODIUM 3 MG PO TABS
3.0000 mg | ORAL_TABLET | Freq: Once | ORAL | Status: AC
  Administered 2012-04-29: 3 mg via ORAL
  Filled 2012-04-29: qty 1

## 2012-04-29 MED ORDER — ENSURE COMPLETE PO LIQD
237.0000 mL | Freq: Two times a day (BID) | ORAL | Status: DC
  Administered 2012-04-29 – 2012-05-02 (×5): 237 mL via ORAL

## 2012-04-29 MED ORDER — PRO-STAT SUGAR FREE PO LIQD
30.0000 mL | Freq: Three times a day (TID) | ORAL | Status: DC
  Administered 2012-04-29 – 2012-05-02 (×8): 30 mL via ORAL
  Filled 2012-04-29 (×10): qty 30

## 2012-04-29 MED ORDER — CITALOPRAM HYDROBROMIDE 20 MG PO TABS
20.0000 mg | ORAL_TABLET | Freq: Every day | ORAL | Status: DC
Start: 2012-04-30 — End: 2012-05-02
  Administered 2012-04-30 – 2012-05-02 (×3): 20 mg via ORAL
  Filled 2012-04-29 (×3): qty 1

## 2012-04-29 NOTE — Progress Notes (Signed)
ANTICOAGULATION CONSULT NOTE - Follow Up Consult  Pharmacy Consult for Coumadin Indication: Acute DVT  No Known Allergies  Patient Measurements: Height: 5' 8.5" (174 cm) Weight: 358 lb 7.5 oz (162.6 kg) IBW/kg (Calculated) : 69.55  Heparin Dosing Weight:   Vital Signs: Temp: 98.7 F (37.1 C) (12/24 1011) Temp src: Oral (12/24 1011) BP: 164/80 mmHg (12/24 1011) Pulse Rate: 84  (12/24 1011)  Labs:  Basename 04/29/12 0500 04/28/12 1050 04/27/12 1611  HGB -- 11.6* --  HCT -- 34.2* --  PLT -- 231 --  APTT -- -- --  LABPROT 23.3* 24.4* 27.8*  INR 2.18* 2.32* 2.76*  HEPARINUNFRC -- -- --  CREATININE 0.83 0.79 --  CKTOTAL -- -- --  CKMB -- -- --  TROPONINI -- -- --    Estimated Creatinine Clearance: 134 ml/min (by C-G formula based on Cr of 0.83).  Assessment: 66yom on Coumadin for acute DVT. INR (2.18) remains therapeutic. Lovenox has been discontinued now that 5 day bridge has been completed and INR remained therapeutic x 24hrs. INR increased significantly with initiation dosing of 5mg  x 2 but seems to be leveling out with Coumadin 3mg  - will repeat and follow-up AM INR. - No CBC this AM - No significant bleeding reported  Goal of Therapy:  INR 2-3   Plan:  1. Repeat Coumadin 3mg  po x 1 today 2. Follow-up AM INR 3. Coumadin education completed with patient and wife  Cleon Dew [phone] 04/29/2012,11:36 AM

## 2012-04-29 NOTE — Progress Notes (Signed)
PATIENT ID: Anthony Strickland   6 Days Post-Op Procedure(s) (LRB): ARTHROSCOPY KNEE (Right)  Subjective: Knee still painful but seems to be improving.  Objective:  Filed Vitals:   04/29/12 1011  BP: 164/80  Pulse: 84  Temp: 98.7 F (37.1 C)  Resp: 18     Right knee no palpable effusion. He says it actually feels better to press on the knee. Incisions are healing well. No drainage. He continues to have some calf pain and tenderness.  Labs:   [facility] 04/28/12 1050  HGB 11.6*   Basename 04/28/12 1050  WBC 26.7*  RBC 3.79*  HCT 34.2*  PLT 231   Basename 04/29/12 0500 04/28/12 1050  NA 136 136  K 3.9 3.5  CL 101 102  CO2 25 25  BUN 10 12  CREATININE 0.83 0.79  GLUCOSE 201* 161*  CALCIUM 7.9* 8.0*    Assessment and Plan: Status post I&D right  septic knee Continue antibiotics per ID. Weightbearing as tolerated right lower extremity DVT treatment per primary team Will follow

## 2012-04-29 NOTE — Progress Notes (Signed)
Physical Therapy Treatment Patient Details Name: Carmine Carrozza MRN: [id number] DOB: Apr 19, 1946 Today's Date: 04/29/2012 Time: 1130-1201 PT Time Calculation (min): 31 min  PT Assessment / Plan / Recommendation Comments on Treatment Session  Pts session limited by pain this session. Upon sitting, pt complaiend of increased pain and the inability to sit for longer than 5 minutes. Pt then completed bed mobility during hygiene. Encouraged pt to sit up throughout the stay when therapy is not there    Follow Up Recommendations  SNF     Does the patient have the potential to tolerate intense rehabilitation     Barriers to Discharge        Equipment Recommendations  Wheelchair (measurements PT)    Recommendations for Other Services    Frequency Min 3X/week   Plan Discharge plan remains appropriate;Frequency remains appropriate    Precautions / Restrictions Precautions Precautions: Fall Restrictions Weight Bearing Restrictions: No   Pertinent Vitals/Pain Pain 10/10. Meds given prior to treatment    Mobility  Bed Mobility Bed Mobility: Supine to Sit;Sitting - Scoot to [place] of Bed;Sit to Supine;Rolling Right;Rolling Left Rolling Right: 4: Min assist;With rail Rolling Left: 4: Min assist;With rail Supine to Sit: 1: +2 Total assist;HOB elevated Supine to Sit: Patient Percentage: 40% Sitting - Scoot to Edge of Bed: 1: +2 Total assist Sitting - Scoot to Edge of Bed: Patient Percentage: 50% Sit to Supine: 1: +2 Total assist;HOB flat Sit to Supine: Patient Percentage: 30% Scooting to HOB: 4: Min assist Details for Bed Mobility Assistance: Assist with RLE throughout transfer, assist with LLE back into bed. Pt able to bring self towards [facility] with UEs. Rolling to complete hygiene. Transfers Transfers: Not assessed Ambulation/Gait Ambulation/Gait Assistance: Not tested (comment)    Exercises General Exercises - Lower Extremity Long Arc Quad: AAROM;Strengthening;Right;10 reps;Seated    PT Diagnosis:    PT Problem List:   PT Treatment Interventions:     PT Goals Acute Rehab PT Goals PT Goal: Rolling Supine to Right Side - Progress: Progressing toward goal PT Goal: Rolling Supine to Left Side - Progress: Progressing toward goal PT Goal: Supine/Side to Sit - Progress: Progressing toward goal PT Goal: Sit to Supine/Side - Progress: Progressing toward goal  Visit Information  Last PT Received On: 04/29/12 Assistance Needed: +2    Subjective Data      Cognition  Overall Cognitive Status: Appears within functional limits for tasks assessed/performed Arousal/Alertness: Awake/alert Orientation Level: Appears intact for tasks assessed Behavior During Session: [facility] for tasks performed    Balance     End of Session PT - End of Session Activity Tolerance: Patient limited by fatigue;Patient limited by pain Patient left: in bed;with call bell/phone within reach;with family/visitor present Nurse Communication: Mobility status   GP     Milana Kidney 04/29/2012, 1:15 PM

## 2012-04-29 NOTE — Progress Notes (Signed)
[facility]             Progress Note  Brief summary: This is a 66/M  past medical history of hypertension, BPH asthma and osteoarthritis that comes in for confusion, fever, poor po intake and R knee pain, found to be in septic shock, responded to aggressive fluid resuscitation, did not require pressors and was admitted to SDU. Subsequently found to have septic arthritis of R knee with group B strep followed by I&D per ORtho   Subjective: C/o nausea, R knee pain still very severe, overall swelling starting to improve, getting narcotics Q4, has not gotten out of bed yet Objective: Vital signs in last 24 hours: Temp:  [97.4 F (36.3 C)-99.5 F (37.5 C)] 97.4 F (36.3 C) (12/24 0417) Pulse Rate:  [73-87] 73  (12/24 0417) Resp:  [20] 20  (12/24 0417) BP: (157-182)/(73-83) 160/77 mmHg (12/24 0417) SpO2:  [94 %-97 %] 97 % (12/24 0417) Weight change:  Last BM Date: 04/28/12  Intake/Output from previous day: 12/23 0701 - 12/24 0700 In: -  Out: 1800 [Urine:1800]     Physical Exam: General: Alert, awake, oriented x3, in no acute distress. HEENT: No bruits, no goiter. Heart: Regular rate and rhythm, without murmurs, rubs, gallops. Lungs: Clear to auscultation bilaterally, distant BS at bases Abdomen: Soft, nontender, nondistended, positive bowel sounds. Extremities: R knee swollen, RLE edema 2 plus, LLE 1 plus edema with positive pedal pulses. Neuro: Grossly intact, nonfocal.    Lab Results: Basic Metabolic Panel:  Basename 04/29/12 0500 04/28/12 1050  NA 136 136  K 3.9 3.5  CL 101 102  CO2 25 25  GLUCOSE 201* 161*  BUN 10 12  CREATININE 0.83 0.79  CALCIUM 7.9* 8.0*  MG -- --  PHOS -- --   Liver Function Tests: No results found for this basename: AST:2,ALT:2,ALKPHOS:2,BILITOT:2,PROT:2,ALBUMIN:2 in the last 72 hours No results found for this basename: LIPASE:2,AMYLASE:2 in the last 72 hours No results found for this basename: AMMONIA:2 in the last 72  hours CBC:  Basename 04/28/12 1050  WBC 26.7*  NEUTROABS --  HGB 11.6*  HCT 34.2*  MCV 90.2  PLT 231   Cardiac Enzymes: No results found for this basename: CKTOTAL:3,CKMB:3,CKMBINDEX:3,TROPONINI:3 in the last 72 hours BNP: No results found for this basename: PROBNP:3 in the last 72 hours D-Dimer: No results found for this basename: DDIMER:2 in the last 72 hours CBG:  Basename 04/29/12 0740 04/28/12 2130 04/28/12 1602 04/28/12 1113 04/28/12 0733 04/27/12 2134  GLUCAP 184* 218* 149* 145* 166* 149*   Hemoglobin A1C: No results found for this basename: HGBA1C in the last 72 hours Fasting Lipid Panel: No results found for this basename: CHOL,HDL,LDLCALC,TRIG,CHOLHDL,LDLDIRECT in the last 72 hours Thyroid Function Tests: No results found for this basename: TSH,T4TOTAL,FREET4,T3FREE,THYROIDAB in the last 72 hours Anemia Panel: No results found for this basename: VITAMINB12,FOLATE,FERRITIN,TIBC,IRON,RETICCTPCT in the last 72 hours Coagulation:  Basename 04/29/12 0500 04/28/12 1050  LABPROT 23.3* 24.4*  INR 2.18* 2.32*   Urine Drug Screen: Drugs of Abuse     Component Value Date/Time   LABOPIA NONE DETECTED 04/21/2012 1646   COCAINSCRNUR NONE DETECTED 04/21/2012 1646   LABBENZ NONE DETECTED 04/21/2012 1646   AMPHETMU NONE DETECTED 04/21/2012 1646   THCU NONE DETECTED 04/21/2012 1646   LABBARB NONE DETECTED 04/21/2012 1646    Alcohol Level: No results found for this basename: ETH:2 in the last 72 hours Urinalysis: No results found for this basename: COLORURINE:2,APPERANCEUR:2,LABSPEC:2,PHURINE:2,GLUCOSEU:2,HGBUR:2,BILIRUBINUR:2,KETONESUR:2,PROTEINUR:2,UROBILINOGEN:2,NITRITE:2,LEUKOCYTESUR:2 in the last 72 hours Recent Results (from the  past 240 hour(s))  URINE CULTURE     Status: Normal   Collection Time   04/21/12  4:46 PM      Component Value Range Status Comment   Specimen Description URINE, CLEAN CATCH   Final    Special Requests NONE   Final    Culture  Setup Time  04/21/2012 22:45   Final    Colony Count 35,000 COLONIES/ML   Final    Culture     Final    Value: GROUP B STREP(S.AGALACTIAE)ISOLATED     Note: TESTING AGAINST S. AGALACTIAE NOT ROUTINELY PERFORMED DUE TO PREDICTABILITY OF AMP/PEN/VAN SUSCEPTIBILITY.   Report Status 04/24/2012 FINAL   Final   URINE CULTURE     Status: Normal   Collection Time   04/22/12  5:10 AM      Component Value Range Status Comment   Specimen Description URINE, CATHETERIZED   Final    Special Requests NONE   Final    Culture  Setup Time 04/22/2012 09:26   Final    Colony Count NO GROWTH   Final    Culture NO GROWTH   Final    Report Status 04/23/2012 FINAL   Final   MRSA PCR SCREENING     Status: Normal   Collection Time   04/22/12  8:03 AM      Component Value Range Status Comment   MRSA by PCR NEGATIVE  NEGATIVE Final   BODY FLUID CULTURE     Status: Normal   Collection Time   04/22/12 10:52 PM      Component Value Range Status Comment   Specimen Description SYNOVIAL RIGHT KNEE   Final    Special Requests NONE   Final    Gram Stain     Final    Value: ABUNDANT WBC PRESENT, PREDOMINANTLY PMN     MODERATE GRAM POSITIVE COCCI     IN PAIRS IN CHAINS Gram Stain Report Called to,Read Back By and Verified With: Gram Stain Report Called to,Read Back By and Verified With: CAROLYN WHITE 04/23/12 10:43AM BY MILSH   Culture     Final    Value: ABUNDANT GROUP B STREP(S.AGALACTIAE)ISOLATED     Note: TESTING AGAINST S. AGALACTIAE NOT ROUTINELY PERFORMED DUE TO PREDICTABILITY OF AMP/PEN/VAN SUSCEPTIBILITY.   Report Status 04/24/2012 FINAL   Final    Organism ID, Bacteria GROUP B STREP(S.AGALACTIAE)ISOLATED   Final   SURGICAL PCR SCREEN     Status: Normal   Collection Time   04/23/12 11:09 AM      Component Value Range Status Comment   MRSA, PCR NEGATIVE  NEGATIVE Final    Staphylococcus aureus NEGATIVE  NEGATIVE Final   CULTURE, BLOOD (ROUTINE X 2)     Status: Normal (Preliminary result)   Collection Time    04/25/12  3:49 PM      Component Value Range Status Comment   Specimen Description BLOOD RIGHT HAND   Final    Special Requests     Final    Value: BOTTLES DRAWN AEROBIC AND ANAEROBIC 10CC AER 5CC ANA   Culture  Setup Time 04/25/2012 22:00   Final    Culture     Final    Value:        BLOOD CULTURE RECEIVED NO GROWTH TO DATE CULTURE WILL BE HELD FOR 5 DAYS BEFORE ISSUING A FINAL NEGATIVE REPORT   Report Status PENDING   Incomplete   CULTURE, BLOOD (ROUTINE X 2)     Status: Normal (Preliminary  result)   Collection Time   04/26/12 12:30 PM      Component Value Range Status Comment   Specimen Description BLOOD LEFT ARM   Final    Special Requests BOTTLES DRAWN AEROBIC ONLY 10CC   Final    Culture  Setup Time 04/26/2012 20:04   Final    Culture     Final    Value:        BLOOD CULTURE RECEIVED NO GROWTH TO DATE CULTURE WILL BE HELD FOR 5 DAYS BEFORE ISSUING A FINAL NEGATIVE REPORT   Report Status PENDING   Incomplete   CULTURE, BLOOD (ROUTINE X 2)     Status: Normal (Preliminary result)   Collection Time   04/26/12 12:40 PM      Component Value Range Status Comment   Specimen Description BLOOD RIGHT ARM   Final    Special Requests BOTTLES DRAWN AEROBIC ONLY 8CC   Final    Culture  Setup Time 04/26/2012 20:05   Final    Culture     Final    Value:        BLOOD CULTURE RECEIVED NO GROWTH TO DATE CULTURE WILL BE HELD FOR 5 DAYS BEFORE ISSUING A FINAL NEGATIVE REPORT   Report Status PENDING   Incomplete     Studies/Results: No results found.  Medications: Scheduled Meds:    . cefTRIAXone (ROCEPHIN)  IV  2 g Intravenous Q24H  . citalopram  40 mg Oral Daily  . feeding supplement  237 mL Oral BID BM  . feeding supplement  30 mL Oral TID WC  . furosemide  40 mg Oral BID  . insulin aspart  0-15 Units Subcutaneous TID WC  . insulin aspart  3 Units Subcutaneous TID WC  . insulin glargine  25 Units Subcutaneous QHS  . living well with diabetes book   Does not apply Once  . metoprolol   50 mg Oral Daily  . multivitamin with minerals  1 tablet Oral Daily  . pantoprazole  40 mg Oral BID AC  . potassium chloride  40 mEq Oral Daily  . senna-docusate  2 tablet Oral QHS  . sodium chloride  10 mL Intravenous Q12H  . sodium chloride  3 mL Intravenous Q12H  . traZODone  300 mg Oral QHS  . Warfarin - Pharmacist Dosing Inpatient   Does not apply q1800   Continuous Infusions:  PRN Meds:.acetaminophen, acetaminophen, calcium carbonate, morphine injection, nitroGLYCERIN, ondansetron (ZOFRAN) IV, ondansetron, oxyCODONE, polyethylene glycol, promethazine, sodium chloride, sorbitol  Assessment/Plan: Septic shock: resolved, de-escalated abx  R knee septic arthritis: s/p knee aspiration 12/17 WBC 200K, changed abx to Vancomycin and Zosyn 12/18, synovial fluid cultures with Group B strep, s/p  Joint irrigation and debridement per Dr.Chandler 12/18 Changed to IV Rocephin 12/20, will need this for 6 weeks, Greatly appreciate ID consult Blood cultures from 12/20 negative so far and 12/21 negative so far PICC placed yesterday 2D Echo with normal EF and no other abnormalities   Ortho following, weight bearing as tolerated TEE not pursued since clinically no evidence of endocarditis, subsequent blood cultures have a been negative and will be treated with IV rocephin for 6 weeks anyway  UTI (lower urinary tract infection):  - urine cultures negative and re-incubated  Acute kidney injury:  - This most likely secondary to septic shock and NSAIDs use. Improved, stopped IVF .  Metabolic encephalopathy:  - Multifactorial most likely secondary to sepsis: resolved   Hyponatremia:  - Is mostly secondary to  decreased intravascular volume , resolved  R leg DVT: Continue Coumadin, was bridged with Lovenox bridge Day 5/5 yesterday, stop lovenox  DM: continue lantus 25Units, novolog SSI and with meals  Lower extremity edema: from third spacing of IVF given for sepsis resuscitation: continue  lasix  Leukocytosis: clinically improving but WBC still UP, afebrile, on IV rocephin as above, blood Cx negative, continue to monitor, no diarrhea  Code Status: full  Family Communication: discussed with patient at bedside and called and talked to wife 12/22 Disposition Plan: SNF soon, CSW consult    Time spent coordinating care:   LOS: 8 days   [facility] Pager: [phone] 04/29/2012, 9:36 AM

## 2012-04-29 NOTE — Progress Notes (Signed)
INITIAL NUTRITION ASSESSMENT  DOCUMENTATION CODES Per approved criteria  -Morbid Obesity   INTERVENTION: 1. 30 ml Prostat po TID, each supplement provides 100 kcal and 15 grams protein. 2. Ensure Complete po BID, each supplement provides 350 kcal and 13 grams of protein. 3. MVI daily 4. RD to continue to follow nutrition care plan  NUTRITION DIAGNOSIS: Inadequate oral intake related to poor appetite and pain as evidenced by pt report and poor meal completion.   Goal: Pt to meet >/= 90% of their estimated nutrition needs.  Monitor:  weight trends, lab trends, I/O's, PO intake, supplement tolerance  Reason for Assessment: Malnutrition Screening  66 y.o. male  Admitting Dx: Septic shock  ASSESSMENT: Admitted with metabolic encephalopathy. Pt was unable to consume POs (foods or liquids) 2 days PTA. Transferred from [facility] 2/2 septic shock likely from UTI. Septic knee aspirated 12/17. Underwent arthroscopy on 12/18.  Intake has been variable since admission, ranging from 0-100%. Per chart, more recent intake approximately 50%. Pt reports that his intake has been poor. Not a breakfast eater, so typically refuses breakfast. Eats better when wife is available per RN. Pt agreeable to trying Ensure Complete. Discussed importance of nutrition.  Height: Ht Readings from Last 1 Encounters:  04/27/12 5' 8.5" (1.74 m)   Weight: Wt Readings from Last 1 Encounters:  04/27/12 358 lb 7.5 oz (162.6 kg)    Ideal Body Weight: 71.4 kg  % Ideal Body Weight: 227%  Wt Readings from Last 10 Encounters:  04/27/12 358 lb 7.5 oz (162.6 kg)  04/27/12 358 lb 7.5 oz (162.6 kg)    Usual Body Weight: n/a  % Usual Body Weight: n/a  BMI:  Body mass index is 53.71 kg/(m^2). Obese Class III (extreme)  Estimated Nutritional Needs: Kcal: 2200 - 2400 kcal Protein: 100 - 115 grams Fluid: 2.2 - 2.4 liters daily  Skin: knee incision  Diet Order: Carb Control Medium Diet with 1500 ml fluid  restriciton  EDUCATION NEEDS: -No education needs identified at this time   Intake/Output Summary (Last 24 hours) at 04/29/12 0918 Last data filed at 04/29/12 0541  Gross per 24 hour  Intake      0 ml  Output   1800 ml  Net  -1800 ml    Last BM: 12/23  Labs:   Lab 04/29/12 0500 04/28/12 1050 04/26/12 0530  NA 136 136 138  K 3.9 3.5 4.0  CL 101 102 104  CO2 25 25 26   BUN 10 12 27*  CREATININE 0.83 0.79 1.06  CALCIUM 7.9* 8.0* 8.4  MG -- -- --  PHOS -- -- --  GLUCOSE 201* 161* 215*    CBG (last 3)   Basename 04/29/12 0740 04/28/12 2130 04/28/12 1602  GLUCAP 184* 218* 149*    Scheduled Meds:   . cefTRIAXone (ROCEPHIN)  IV  2 g Intravenous Q24H  . citalopram  40 mg Oral Daily  . furosemide  40 mg Oral BID  . insulin aspart  0-15 Units Subcutaneous TID WC  . insulin aspart  3 Units Subcutaneous TID WC  . insulin glargine  25 Units Subcutaneous QHS  . living well with diabetes book   Does not apply Once  . metoprolol  50 mg Oral Daily  . pantoprazole  40 mg Oral BID AC  . potassium chloride  40 mEq Oral Daily  . senna-docusate  2 tablet Oral QHS  . sodium chloride  10 mL Intravenous Q12H  . sodium chloride  3 mL Intravenous Q12H  .  traZODone  300 mg Oral QHS  . Warfarin - Pharmacist Dosing Inpatient   Does not apply q1800    Continuous Infusions:   Past Medical History  Diagnosis Date  . Asthma   . Rheumatoid arthritis   . Hypertension   . Depression     Past Surgical History  Procedure Date  . Coronary stent placement   . Knee arthroscopy 04/23/2012    Procedure: ARTHROSCOPY KNEE;  Surgeon: Mable Paris, MD;  Location: [facility] OR;  Service: Orthopedics;  Laterality: Right;    Jarold Motto MS, RD, LDN Pager: [phone] After-hours pager: [phone]

## 2012-04-30 DIAGNOSIS — M009 Pyogenic arthritis, unspecified: Secondary | ICD-10-CM

## 2012-04-30 DIAGNOSIS — A419 Sepsis, unspecified organism: Secondary | ICD-10-CM

## 2012-04-30 LAB — CBC
HCT: 34.6 % — ABNORMAL LOW (ref 39.0–52.0)
MCH: 30.7 pg (ref 26.0–34.0)
MCHC: 32.9 g/dL (ref 30.0–36.0)
MCV: 93.3 fL (ref 78.0–100.0)
MCV: 92.2 fL (ref 78.0–100.0)
Platelets: 451 10*3/uL — ABNORMAL HIGH (ref 150–400)
RBC: 3.59 MIL/uL — ABNORMAL LOW (ref 4.22–5.81)
RDW: 15.1 % (ref 11.5–15.5)
WBC: 18.6 10*3/uL — ABNORMAL HIGH (ref 4.0–10.5)

## 2012-04-30 LAB — BASIC METABOLIC PANEL
BUN: 8 mg/dL (ref 6–23)
Creatinine, Ser: 0.82 mg/dL (ref 0.50–1.35)
GFR calc Af Amer: >90 mL/min (ref >90)
GFR calc non Af Amer: >90 mL/min (ref >90)

## 2012-04-30 LAB — GLUCOSE, CAPILLARY
Glucose-Capillary: 136 mg/dL — ABNORMAL HIGH (ref 70–99)
Glucose-Capillary: 241 mg/dL — ABNORMAL HIGH (ref 70–99)

## 2012-04-30 MED ORDER — WARFARIN SODIUM 4 MG PO TABS
4.0000 mg | ORAL_TABLET | Freq: Once | ORAL | Status: AC
  Administered 2012-04-30: 4 mg via ORAL
  Filled 2012-04-30: qty 1

## 2012-04-30 MED ORDER — FUROSEMIDE 10 MG/ML IJ SOLN
40.0000 mg | Freq: Three times a day (TID) | INTRAMUSCULAR | Status: DC
  Administered 2012-04-30 – 2012-05-02 (×6): 40 mg via INTRAVENOUS
  Filled 2012-04-30 (×9): qty 4

## 2012-04-30 NOTE — Progress Notes (Signed)
[facility]             Progress Note  Brief summary: This is a 66/M  past medical history of hypertension, BPH asthma and osteoarthritis that comes in for confusion, fever, poor po intake and R knee pain, found to be in septic shock, responded to aggressive fluid resuscitation, did not require pressors and was admitted to SDU. Subsequently found to have septic arthritis of R knee with group B strep followed by I&D per ORtho   Subjective: Can be discharged tomorrow to SNF if it's okay with orthopedics and a bed is available. He denies any significant complaints.   Objective: Vital signs in last 24 hours: Temp:  [97.8 F (36.6 C)-98.5 F (36.9 C)] 97.8 F (36.6 C) (12/25 0900) Pulse Rate:  [74-96] 96  (12/25 0900) Resp:  [18-19] 18  (12/25 0900) BP: (142-177)/(59-82) 177/80 mmHg (12/25 0900) SpO2:  [92 %-96 %] 96 % (12/25 0900) Weight change:  Last BM Date: 04/29/12  Intake/Output from previous day: 12/24 0701 - 12/25 0700 In: 120 [P.O.:120] Out: 2450 [Urine:2450]     Physical Exam: General: Alert, awake, oriented x3, in no acute distress. HEENT: No bruits, no goiter. Heart: Regular rate and rhythm, without murmurs, rubs, gallops. Lungs: Clear to auscultation bilaterally, distant BS at bases Abdomen: Soft, nontender, nondistended, positive bowel sounds. Extremities: R knee swollen, RLE edema 2 plus, LLE 1 plus edema with positive pedal pulses. Neuro: Grossly intact, nonfocal.    Lab Results: Basic Metabolic Panel:  Basename 04/30/12 0500 04/29/12 0500  NA 136 136  K 4.1 3.9  CL 101 101  CO2 28 25  GLUCOSE 200* 201*  BUN 8 10  CREATININE 0.82 0.83  CALCIUM 8.0* 7.9*  MG -- --  PHOS -- --   Liver Function Tests: No results found for this basename: AST:2,ALT:2,ALKPHOS:2,BILITOT:2,PROT:2,ALBUMIN:2 in the last 72 hours No results found for this basename: LIPASE:2,AMYLASE:2 in the last 72 hours No results found for this basename: AMMONIA:2 in the  last 72 hours CBC:  Basename 04/30/12 0500 04/28/12 1050  WBC 17.9* 26.7*  NEUTROABS -- --  HGB 11.4* 11.6*  HCT 34.6* 34.2*  MCV 93.3 90.2  PLT 407* 231   Cardiac Enzymes: No results found for this basename: CKTOTAL:3,CKMB:3,CKMBINDEX:3,TROPONINI:3 in the last 72 hours BNP: No results found for this basename: PROBNP:3 in the last 72 hours D-Dimer: No results found for this basename: DDIMER:2 in the last 72 hours CBG:  Basename 04/30/12 1122 04/30/12 0834 04/29/12 2151 04/29/12 1655 04/29/12 1129 04/29/12 0740  GLUCAP 246* 227* 143* 150* 143* 184*   Hemoglobin A1C: No results found for this basename: HGBA1C in the last 72 hours Fasting Lipid Panel: No results found for this basename: CHOL,HDL,LDLCALC,TRIG,CHOLHDL,LDLDIRECT in the last 72 hours Thyroid Function Tests: No results found for this basename: TSH,T4TOTAL,FREET4,T3FREE,THYROIDAB in the last 72 hours Anemia Panel: No results found for this basename: VITAMINB12,FOLATE,FERRITIN,TIBC,IRON,RETICCTPCT in the last 72 hours Coagulation:  Basename 04/30/12 0500 04/29/12 0500  LABPROT 22.4* 23.3*  INR 2.06* 2.18*   Urine Drug Screen: Drugs of Abuse     Component Value Date/Time   LABOPIA NONE DETECTED 04/21/2012 1646   COCAINSCRNUR NONE DETECTED 04/21/2012 1646   LABBENZ NONE DETECTED 04/21/2012 1646   AMPHETMU NONE DETECTED 04/21/2012 1646   THCU NONE DETECTED 04/21/2012 1646   LABBARB NONE DETECTED 04/21/2012 1646    Alcohol Level: No results found for this basename: ETH:2 in the last 72 hours Urinalysis: No results found for this basename: COLORURINE:2,APPERANCEUR:2,LABSPEC:2,PHURINE:2,GLUCOSEU:2,HGBUR:2,BILIRUBINUR:2,KETONESUR:2,PROTEINUR:2,UROBILINOGEN:2,NITRITE:2,LEUKOCYTESUR:2 in  the last 72 hours Recent Results (from the past 240 hour(s))  URINE CULTURE     Status: Normal   Collection Time   04/21/12  4:46 PM      Component Value Range Status Comment   Specimen Description URINE, CLEAN CATCH   Final     Special Requests NONE   Final    Culture  Setup Time 04/21/2012 22:45   Final    Colony Count 35,000 COLONIES/ML   Final    Culture     Final    Value: GROUP B STREP(S.AGALACTIAE)ISOLATED     Note: TESTING AGAINST S. AGALACTIAE NOT ROUTINELY PERFORMED DUE TO PREDICTABILITY OF AMP/PEN/VAN SUSCEPTIBILITY.   Report Status 04/24/2012 FINAL   Final   URINE CULTURE     Status: Normal   Collection Time   04/22/12  5:10 AM      Component Value Range Status Comment   Specimen Description URINE, CATHETERIZED   Final    Special Requests NONE   Final    Culture  Setup Time 04/22/2012 09:26   Final    Colony Count NO GROWTH   Final    Culture NO GROWTH   Final    Report Status 04/23/2012 FINAL   Final   MRSA PCR SCREENING     Status: Normal   Collection Time   04/22/12  8:03 AM      Component Value Range Status Comment   MRSA by PCR NEGATIVE  NEGATIVE Final   BODY FLUID CULTURE     Status: Normal   Collection Time   04/22/12 10:52 PM      Component Value Range Status Comment   Specimen Description SYNOVIAL RIGHT KNEE   Final    Special Requests NONE   Final    Gram Stain     Final    Value: ABUNDANT WBC PRESENT, PREDOMINANTLY PMN     MODERATE GRAM POSITIVE COCCI     IN PAIRS IN CHAINS Gram Stain Report Called to,Read Back By and Verified With: Gram Stain Report Called to,Read Back By and Verified With: CAROLYN WHITE 04/23/12 10:43AM BY MILSH   Culture     Final    Value: ABUNDANT GROUP B STREP(S.AGALACTIAE)ISOLATED     Note: TESTING AGAINST S. AGALACTIAE NOT ROUTINELY PERFORMED DUE TO PREDICTABILITY OF AMP/PEN/VAN SUSCEPTIBILITY.   Report Status 04/24/2012 FINAL   Final    Organism ID, Bacteria GROUP B STREP(S.AGALACTIAE)ISOLATED   Final   SURGICAL PCR SCREEN     Status: Normal   Collection Time   04/23/12 11:09 AM      Component Value Range Status Comment   MRSA, PCR NEGATIVE  NEGATIVE Final    Staphylococcus aureus NEGATIVE  NEGATIVE Final   CULTURE, BLOOD (ROUTINE X 2)     Status:  Normal (Preliminary result)   Collection Time   04/25/12  3:49 PM      Component Value Range Status Comment   Specimen Description BLOOD RIGHT HAND   Final    Special Requests     Final    Value: BOTTLES DRAWN AEROBIC AND ANAEROBIC 10CC AER 5CC ANA   Culture  Setup Time 04/25/2012 22:00   Final    Culture     Final    Value:        BLOOD CULTURE RECEIVED NO GROWTH TO DATE CULTURE WILL BE HELD FOR 5 DAYS BEFORE ISSUING A FINAL NEGATIVE REPORT   Report Status PENDING   Incomplete   CULTURE, BLOOD (ROUTINE X  2)     Status: Normal (Preliminary result)   Collection Time   04/26/12 12:30 PM      Component Value Range Status Comment   Specimen Description BLOOD LEFT ARM   Final    Special Requests BOTTLES DRAWN AEROBIC ONLY 10CC   Final    Culture  Setup Time 04/26/2012 20:04   Final    Culture     Final    Value:        BLOOD CULTURE RECEIVED NO GROWTH TO DATE CULTURE WILL BE HELD FOR 5 DAYS BEFORE ISSUING A FINAL NEGATIVE REPORT   Report Status PENDING   Incomplete   CULTURE, BLOOD (ROUTINE X 2)     Status: Normal (Preliminary result)   Collection Time   04/26/12 12:40 PM      Component Value Range Status Comment   Specimen Description BLOOD RIGHT ARM   Final    Special Requests BOTTLES DRAWN AEROBIC ONLY 8CC   Final    Culture  Setup Time 04/26/2012 20:05   Final    Culture     Final    Value:        BLOOD CULTURE RECEIVED NO GROWTH TO DATE CULTURE WILL BE HELD FOR 5 DAYS BEFORE ISSUING A FINAL NEGATIVE REPORT   Report Status PENDING   Incomplete     Studies/Results: No results found.  Medications: Scheduled Meds:    . cefTRIAXone (ROCEPHIN)  IV  2 g Intravenous Q24H  . citalopram  20 mg Oral Daily  . feeding supplement  237 mL Oral BID BM  . feeding supplement  30 mL Oral TID WC  . furosemide  40 mg Oral BID  . insulin aspart  0-15 Units Subcutaneous TID WC  . insulin aspart  3 Units Subcutaneous TID WC  . insulin glargine  25 Units Subcutaneous QHS  . living well with  diabetes book   Does not apply Once  . metoprolol  50 mg Oral Daily  . multivitamin with minerals  1 tablet Oral Daily  . pantoprazole  40 mg Oral BID AC  . potassium chloride  40 mEq Oral Daily  . senna-docusate  2 tablet Oral QHS  . sodium chloride  10 mL Intravenous Q12H  . sodium chloride  3 mL Intravenous Q12H  . traZODone  300 mg Oral QHS  . warfarin  4 mg Oral ONCE-1800  . Warfarin - Pharmacist Dosing Inpatient   Does not apply q1800   Continuous Infusions:  PRN Meds:.acetaminophen, acetaminophen, calcium carbonate, morphine injection, nitroGLYCERIN, ondansetron (ZOFRAN) IV, ondansetron, oxyCODONE, polyethylene glycol, promethazine, sodium chloride, sorbitol  Assessment/Plan: Septic shock: resolved, de-escalated abx  R knee septic arthritis: s/p knee aspiration 12/17 WBC 200K, changed abx to Vancomycin and Zosyn 12/18, synovial fluid cultures with Group B strep, s/p  Joint irrigation and debridement per Dr.Chandler 12/18 Changed to IV Rocephin 12/20, will need this for 6 weeks, Greatly appreciate ID consult Blood cultures from 12/20 negative so far and 12/21 negative so far PICC placed yesterday 2D Echo with normal EF and no other abnormalities   Ortho following, weight bearing as tolerated TEE not pursued since clinically no evidence of endocarditis, subsequent blood cultures have a been negative and will be treated with IV rocephin for 6 weeks anyway  UTI (lower urinary tract infection):  - urine cultures negative and re-incubated  Acute kidney injury:  - This most likely secondary to septic shock and NSAIDs use. Improved, stopped IVF .  Metabolic encephalopathy:  -  Multifactorial most likely secondary to sepsis: resolved   Hyponatremia:  - Is mostly secondary to decreased intravascular volume , resolved  R leg DVT: Continue Coumadin, was bridged with Lovenox for a total of 5 days, Lovenox discontinued  DM: continue lantus 25Units, novolog SSI and with meals  Lower  extremity edema: from third spacing of IVF given for sepsis resuscitation: continue lasix  Leukocytosis: Leukocytosis is improving, afebrile, on IV rocephin as above, blood Cx negative, continue to monitor, no diarrhea  Code Status: full  Family Communication: discussed with patient at bedside and called and talked to wife 12/22 Disposition Plan: SNF likely in am    Time spent coordinating care:   LOS: 9 days   [facility] Pager: [phone] 04/30/2012, 1:52 PM

## 2012-04-30 NOTE — Progress Notes (Signed)
ANTICOAGULATION CONSULT NOTE - Follow Up Consult  Pharmacy Consult for Coumadin Indication: Acute DVT  No Known Allergies  Patient Measurements: Height: 5' 8.5" (174 cm) Weight: 358 lb 7.5 oz (162.6 kg) IBW/kg (Calculated) : 69.55  Heparin Dosing Weight:   Vital Signs: Temp: 97.8 F (36.6 C) (12/25 0900) Temp src: Oral (12/25 0900) BP: 177/80 mmHg (12/25 0900) Pulse Rate: 96  (12/25 0900)  Labs:  Basename 04/30/12 0500 04/29/12 0500 04/28/12 1050  HGB 11.4* -- 11.6*  HCT 34.6* -- 34.2*  PLT 407* -- 231  APTT -- -- --  LABPROT 22.4* 23.3* 24.4*  INR 2.06* 2.18* 2.32*  HEPARINUNFRC -- -- --  CREATININE 0.82 0.83 0.79  CKTOTAL -- -- --  CKMB -- -- --  TROPONINI -- -- --    Estimated Creatinine Clearance: 135.7 ml/min (by C-G formula based on Cr of 0.82).  Assessment: 66yom on Coumadin for acute DVT. INR (2.18) remains therapeutic. Lovenox has been discontinued now that 5 day bridge has been completed and INR remained therapeutic x 24hrs. INR increased significantly with initiation dosing of 5mg  x 2 but seems to be leveling out with Coumadin 3mg  to 4 mg doses  Goal of Therapy:  INR 2-3   Plan:  1. Coumadin 4 mg po x 1 today 2. Follow-up AM INR 3. Coumadin education completed with patient and wife  Thank you. Okey Regal, PharmD [phone]  04/30/2012,12:56 PM

## 2012-04-30 NOTE — Progress Notes (Signed)
Subjective: 7 Days Post-Op Procedure(s) (LRB): ARTHROSCOPY KNEE (Right) Patient reports pain as moderate.    Objective: Vital signs in last 24 hours: Temp:  [97.9 F (36.6 C)-98.7 F (37.1 C)] 97.9 F (36.6 C) (12/25 0437) Pulse Rate:  [74-84] 74  (12/25 0437) Resp:  [18-19] 18  (12/25 0437) BP: (142-164)/(59-82) 148/68 mmHg (12/25 0437) SpO2:  [92 %-95 %] 95 % (12/25 0437)  Intake/Output from previous day: 12/24 0701 - 12/25 0700 In: 120 [P.O.:120] Out: 2450 [Urine:2450] Intake/Output this shift:     Basename 04/30/12 0500 04/28/12 1050  HGB 11.4* 11.6*    Basename 04/30/12 0500 04/28/12 1050  WBC 17.9* 26.7*  RBC 3.71* 3.79*  HCT 34.6* 34.2*  PLT 407* 231    Basename 04/30/12 0500 04/29/12 0500  NA 136 136  K 4.1 3.9  CL 101 101  CO2 28 25  BUN 8 10  CREATININE 0.82 0.83  GLUCOSE 200* 201*  CALCIUM 8.0* 7.9*    Basename 04/30/12 0500 04/29/12 0500  LABPT -- --  INR 2.06* 2.18*    Neurologically intact ABD soft Neurovascular intact No cellulitis present Compartment soft Better today No fluid or effusion   Assessment/Plan: 7 Days Post-Op Procedure(s) (LRB): ARTHROSCOPY KNEE (Right) wbc declining today Advance diet  Anthony Strickland L 04/30/2012, 9:10 AM

## 2012-05-01 LAB — GLUCOSE, CAPILLARY
Glucose-Capillary: 174 mg/dL — ABNORMAL HIGH (ref 70–99)
Glucose-Capillary: 152 mg/dL — ABNORMAL HIGH (ref 70–99)

## 2012-05-01 LAB — BASIC METABOLIC PANEL
GFR calc Af Amer: >90 mL/min (ref >90)
GFR calc non Af Amer: 88 mL/min — ABNORMAL LOW (ref >90)
Glucose, Bld: 149 mg/dL — ABNORMAL HIGH (ref 70–99)
Potassium: 3.6 mEq/L (ref 3.5–5.1)
Sodium: 136 mEq/L (ref 135–145)

## 2012-05-01 LAB — CULTURE, BLOOD (ROUTINE X 2): Culture: NO GROWTH

## 2012-05-01 MED ORDER — DEXTROSE 5 % IV SOLN: 2.0000 g | INTRAVENOUS | Status: DC

## 2012-05-01 MED ORDER — WARFARIN SODIUM 5 MG PO TABS
5.0000 mg | ORAL_TABLET | Freq: Once | ORAL | Status: AC
  Administered 2012-05-01: 5 mg via ORAL
  Filled 2012-05-01: qty 1

## 2012-05-01 MED ORDER — HEPARIN SOD (PORK) LOCK FLUSH 100 UNIT/ML IV SOLN
250.0000 [IU] | INTRAVENOUS | Status: AC | PRN
  Administered 2012-05-01: 250 [IU]

## 2012-05-01 MED ORDER — FUROSEMIDE 40 MG PO TABS: 40.0000 mg | ORAL_TABLET | Freq: Every day | ORAL | Status: DC

## 2012-05-01 MED ORDER — WARFARIN SODIUM 5 MG PO TABS: 5.0000 mg | ORAL_TABLET | Freq: Once | ORAL | Status: DC

## 2012-05-01 MED ORDER — OXYCODONE HCL 5 MG PO TABS: 5.0000 mg | ORAL_TABLET | ORAL | Status: DC | PRN

## 2012-05-01 NOTE — Discharge Summary (Signed)
Physician Discharge Summary  Anthony Strickland ZOX:[id number] DOB: 24-Oct-1945 DOA: 04/21/2012  PCP: No primary provider on file.  Admit date: 04/21/2012 Discharge date: 05/01/2012  Time spent: 40 minutes  Recommendations for Outpatient Follow-up:  1. Followup primary care physician. 2. Followup with Dr. Ave Filter and Dr. Algis Liming in 2 weeks  Discharge Diagnoses:  Principal Problem:  *Septic shock Active Problems:  Metabolic encephalopathy  UTI (lower urinary tract infection)  Hyponatremia  Acute kidney injury  Knee pain  Septic arthritis of knee  DVT (deep venous thrombosis)  Diabetes mellitus   Discharge Condition: Stable  Diet recommendation: Regular  Filed Weights   04/26/12 2119 04/27/12 2139 04/30/12 2201  Weight: 164.9 kg (363 lb 8.6 oz) 162.6 kg (358 lb 7.5 oz) 162.6 kg (358 lb 7.5 oz)    History of present illness:  Anthony Strickland is a 66 y.o. male  With past medical history of hypertension, BPH asthma and osteoarthritis that comes in for confusion on the day of admission. As per patient and wife they relate he's been having fevers for the last 2 days. The patient has not been able to drink or eat anything except for his medications for the last 2 days. He relates his knee has been bothering him for the past week and has limited his ability to get up from bed for the last 5 days. This morning his wife relates he was so confused cannot dial a phone number. He denies any burning when he urinates, any nausea vomiting or diarrhea. His last bowel movement was 4 days ago.   Hospital Course:   1. Right knee septic arthritis: Patient admitted to the hospital with fever for 2 days prior to admission, has a lot of right knee pain, redness and swelling. At the time of admission patient was started empirically on antibiotics vancomycin and Zosyn. Knee aspiration on was done on 12/17 which showed 200,000 WBCs. Synovial fluid culture gram-positive strep, Dr. Ave Filter from orthopedics  evaluated the patient and patient went for arthroscopic irrigation and debridement of right septic knee. Afterwards patient was seen by a [facility] of infectious disease and he recommended to switch antibiotics to 2 g of Rocephin for a total of 6 weeks after the right knee synovial fluid grew group B Streptococcus. The finishing date will be 06/08/2011. Transthoracic 2-D echo was done and showed no vegetations, elected not to do TEE because of no symptoms or signs of SBE as well as the negative blood culture.  2. Sepsis/septic shock: The time of admission to the hospital patient did have low blood pressure, rapid heart rate and fever as well as the source for the infection. Patient aggressive hydration with IV fluids, and he was started on stress dose of steroids. Patient was soon weaned off of that and the IV fluids were stopped. This is resolved now, patient is to be on antibiotics for a total of 6 weeks.  3. UTI: Urinalysis the time of admission was consistent with UTI. Urine culture was negative, anyway patient did receive Rocephin for a prolonged period of time.  4. Acute renal failure: Patient presented with creatinine off 2.8, and increased to peak of 3.36 on 04/22/2012, after aggressive IV fluid hydration the renal function improved and creatinine went down to 0.8 which is his baseline. Patient did have a lot of fluids, and he was started on Lasix prior to discharge. According to the chart patient is 11.4 L negative on fluid balance. He is discharged on Lasix 40 mg because of massive  lower extremity swelling, patient needs reevaluation for continuation Lasix probably in 1-2 weeks.  5. Right lower extremity DVT: Because of the swelling, right lower extremity Doppler was obtained and showed acute DVT, patient started on Lovenox and Coumadin and after 5 days of bridging Coumadin was continued. Patient to be on Coumadin for at leas 3-6 month, target INR 2-3. Pitocin the hospital 5 mg of Coumadin  daily.  6. Lower extremity edema: This is multifactorial likely secondary to third spacing from IV fluids given for sepsis resuscitation, the patient also has right lower extremity DVT. Patient was placed on Lasix, and on discharge is on Lasix 40 mg for his lower extremity edema.  Procedures:  Arthroscopic irrigation and debridement of right septic knee  Consultations:  Dr. Algis Liming with infectious diseases.  Dr. Ave Filter with orthopedics.  Discharge Exam: Filed Vitals:   04/30/12 1800 04/30/12 2201 05/01/12 0603 05/01/12 0955  BP: 154/75 153/82 146/64 137/66  Pulse: 99 99 82 90  Temp: 98.5 F (36.9 C) 98.8 F (37.1 C) 99.3 F (37.4 C) 99.9 F (37.7 C)  TempSrc: Oral Oral Oral Oral  Resp: 18 18 18 16   Height:  5\' 9"  (1.753 m)    Weight:  162.6 kg (358 lb 7.5 oz)    SpO2: 95% 97% 99% 95%   General: Alert and awake, oriented x3, not in any acute distress. HEENT: anicteric sclera, pupils reactive to light and accommodation, EOMI CVS: S1-S2 clear, no murmur rubs or gallops Chest: clear to auscultation bilaterally, no wheezing, rales or rhonchi Abdomen: soft nontender, nondistended, normal bowel sounds, no organomegaly Extremities: no cyanosis, clubbing or edema noted bilaterally Neuro: Cranial nerves II-XII intact, no focal neurological deficits  Discharge Instructions      Discharge Orders    Future Appointments: Provider: Department: Dept Phone: Center:   05/29/2012 2:00 PM Randall Hiss, MD [facility] [phone] [facility]     Future Orders Please Complete By Expires   Diet - low sodium heart healthy      Increase activity slowly          Medication List     As of 05/01/2012  1:25 PM    TAKE these medications         aspirin 325 MG EC tablet   Take 325 mg by mouth daily.      benazepril 10 MG tablet   Commonly known as: LOTENSIN   Take 10 mg by mouth daily.      dextrose 5 % SOLN 50 mL with cefTRIAXone 2 G SOLR 2 g    Inject 2 g into the vein daily.      furosemide 40 MG tablet   Commonly known as: LASIX   Take 1 tablet (40 mg total) by mouth daily.      gabapentin 100 MG capsule   Commonly known as: NEURONTIN   Take 200-400 mg by mouth 2 (two) times daily. Takes 200mg  in the morning and takes 400mg  in the evening      metoprolol 50 MG tablet   Commonly known as: LOPRESSOR   Take 50 mg by mouth daily.      nitroGLYCERIN 0.6 MG SL tablet   Commonly known as: NITROSTAT   Place 0.6 mg under the tongue every 5 (five) minutes as needed.      oxyCODONE 5 MG immediate release tablet   Commonly known as: Oxy IR/ROXICODONE   Take 1-2 tablets (5-10 mg total) by mouth every 4 (  four) hours as needed.      pravastatin 20 MG tablet   Commonly known as: PRAVACHOL   Take 20 mg by mouth daily.      terazosin 2 MG capsule   Commonly known as: HYTRIN   Take 2 mg by mouth at bedtime.      trazodone 300 MG tablet   Commonly known as: DESYREL   Take 300 mg by mouth at bedtime.      warfarin 5 MG tablet   Commonly known as: COUMADIN   Take 1 tablet (5 mg total) by mouth one time only at 6 PM.         Follow-up Information    Follow up with Mable Paris, MD. In 2 weeks.   Contact information:   [address] [place] [address] [phone]       Follow up with Acey Lav, MD. In 2 weeks.   Contact information:   [address] [place] [address] [phone]           The results of significant diagnostics from this hospitalization (including imaging, microbiology, ancillary and laboratory) are listed below for reference.    Significant Diagnostic Studies: Dg Chest Port 1 View  04/21/2012  *RADIOLOGY REPORT*  Clinical Data: Central line placement.  PORTABLE CHEST - 1 VIEW  Comparison: Earlier today.  Findings: Interval right jugular catheter with its tip overlying the right lung apex.  Breathing motion blurring.  Stable  enlarged cardiac silhouette and grossly stable prominent pulmonary vasculature.  A small amount of patchy opacity is again demonstrated at the left lateral lung base.  Thoracic spine degenerative changes.  IMPRESSION:  1.  Right jugular catheter tip in the region of the right innominate vein. 2.  No pneumothorax. 3.  Stable cardiomegaly and pulmonary vascular congestion. 4.  Stable small amount of patchy atelectasis or pneumonia at the left lateral lung base.   Original Report Authenticated By: Beckie Salts, M.D.    Dg Chest Port 1 View  04/21/2012  *RADIOLOGY REPORT*  Clinical Data: Shortness of breath.  Weakness.  PORTABLE CHEST - 1 VIEW  Comparison: None.  Findings: 1632 hours.  There is mild breathing artifact and detail is obscured by body habitus.  The heart is mildly enlarged.  There is vascular congestion without overt pulmonary edema.  No pleural effusion or confluent airspace opacity is present.  Telemetry leads overlie the chest.  IMPRESSION: Cardiomegaly with vascular congestion.  No overt pulmonary edema or confluent airspace opacity.   Original Report Authenticated By: Carey Bullocks, M.D.    Dg Knee Complete 4 Views Right  04/21/2012  *RADIOLOGY REPORT*  Clinical Data: Right knee pain and swelling.  RIGHT KNEE - COMPLETE 4+ VIEW  Comparison: None.  Findings: Severe narrowing of all three joint spaces.  This is most pronounced involving the medial joint space, with a bone on bone appearance.  Moderate to marked spur formation involving all three joint compartments.  Moderate sized effusion.  No visible intra- articular loose bodies. Diffuse soft tissue edema.  IMPRESSION: Severe tricompartmental degenerative changes with a moderate sized effusion.   Original Report Authenticated By: Beckie Salts, M.D.     Microbiology: Recent Results (from the past 240 hour(s))  URINE CULTURE     Status: Normal   Collection Time   04/21/12  4:46 PM      Component Value Range Status Comment   Specimen  Description URINE, CLEAN CATCH   Final  Special Requests NONE   Final    Culture  Setup Time 04/21/2012 22:45   Final    Colony Count 35,000 COLONIES/ML   Final    Culture     Final    Value: GROUP B STREP(S.AGALACTIAE)ISOLATED     Note: TESTING AGAINST S. AGALACTIAE NOT ROUTINELY PERFORMED DUE TO PREDICTABILITY OF AMP/PEN/VAN SUSCEPTIBILITY.   Report Status 04/24/2012 FINAL   Final   URINE CULTURE     Status: Normal   Collection Time   04/22/12  5:10 AM      Component Value Range Status Comment   Specimen Description URINE, CATHETERIZED   Final    Special Requests NONE   Final    Culture  Setup Time 04/22/2012 09:26   Final    Colony Count NO GROWTH   Final    Culture NO GROWTH   Final    Report Status 04/23/2012 FINAL   Final   MRSA PCR SCREENING     Status: Normal   Collection Time   04/22/12  8:03 AM      Component Value Range Status Comment   MRSA by PCR NEGATIVE  NEGATIVE Final   BODY FLUID CULTURE     Status: Normal   Collection Time   04/22/12 10:52 PM      Component Value Range Status Comment   Specimen Description SYNOVIAL RIGHT KNEE   Final    Special Requests NONE   Final    Gram Stain     Final    Value: ABUNDANT WBC PRESENT, PREDOMINANTLY PMN     MODERATE GRAM POSITIVE COCCI     IN PAIRS IN CHAINS Gram Stain Report Called to,Read Back By and Verified With: Gram Stain Report Called to,Read Back By and Verified With: CAROLYN WHITE 04/23/12 10:43AM BY MILSH   Culture     Final    Value: ABUNDANT GROUP B STREP(S.AGALACTIAE)ISOLATED     Note: TESTING AGAINST S. AGALACTIAE NOT ROUTINELY PERFORMED DUE TO PREDICTABILITY OF AMP/PEN/VAN SUSCEPTIBILITY.   Report Status 04/24/2012 FINAL   Final    Organism ID, Bacteria GROUP B STREP(S.AGALACTIAE)ISOLATED   Final   SURGICAL PCR SCREEN     Status: Normal   Collection Time   04/23/12 11:09 AM      Component Value Range Status Comment   MRSA, PCR NEGATIVE  NEGATIVE Final    Staphylococcus aureus NEGATIVE  NEGATIVE Final    CULTURE, BLOOD (ROUTINE X 2)     Status: Normal   Collection Time   04/25/12  3:49 PM      Component Value Range Status Comment   Specimen Description BLOOD RIGHT HAND   Final    Special Requests     Final    Value: BOTTLES DRAWN AEROBIC AND ANAEROBIC 10CC AER 5CC ANA   Culture  Setup Time 04/25/2012 22:00   Final    Culture NO GROWTH 5 DAYS   Final    Report Status 05/01/2012 FINAL   Final   CULTURE, BLOOD (ROUTINE X 2)     Status: Normal (Preliminary result)   Collection Time   04/26/12 12:30 PM      Component Value Range Status Comment   Specimen Description BLOOD LEFT ARM   Final    Special Requests BOTTLES DRAWN AEROBIC ONLY 10CC   Final    Culture  Setup Time 04/26/2012 20:04   Final    Culture     Final    Value:  BLOOD CULTURE RECEIVED NO GROWTH TO DATE CULTURE WILL BE HELD FOR 5 DAYS BEFORE ISSUING A FINAL NEGATIVE REPORT   Report Status PENDING   Incomplete   CULTURE, BLOOD (ROUTINE X 2)     Status: Normal (Preliminary result)   Collection Time   04/26/12 12:40 PM      Component Value Range Status Comment   Specimen Description BLOOD RIGHT ARM   Final    Special Requests BOTTLES DRAWN AEROBIC ONLY 8CC   Final    Culture  Setup Time 04/26/2012 20:05   Final    Culture     Final    Value:        BLOOD CULTURE RECEIVED NO GROWTH TO DATE CULTURE WILL BE HELD FOR 5 DAYS BEFORE ISSUING A FINAL NEGATIVE REPORT   Report Status PENDING   Incomplete      Labs: Basic Metabolic Panel:  Lab 05/01/12 9604 04/30/12 0500 04/29/12 0500 04/28/12 1050 04/26/12 0530  NA 136 136 136 136 138  K 3.6 4.1 3.9 3.5 4.0  CL 98 101 101 102 104  CO2 31 28 25 25 26   GLUCOSE 149* 200* 201* 161* 215*  BUN 10 8 10 12  27*  CREATININE 0.88 0.82 0.83 0.79 1.06  CALCIUM 7.9* 8.0* 7.9* 8.0* 8.4  MG -- -- -- -- --  PHOS -- -- -- -- --   Liver Function Tests: No results found for this basename: AST:5,ALT:5,ALKPHOS:5,BILITOT:5,PROT:5,ALBUMIN:5 in the last 168 hours No results found for this  basename: LIPASE:5,AMYLASE:5 in the last 168 hours No results found for this basename: AMMONIA:5 in the last 168 hours CBC:  Lab 04/30/12 1533 04/30/12 0500 04/28/12 1050 04/26/12 0530 04/25/12 0500  WBC 18.6* 17.9* 26.7* 24.2* 25.3*  NEUTROABS -- -- -- -- --  HGB 10.9* 11.4* 11.6* 11.8* 11.4*  HCT 33.1* 34.6* 34.2* 36.1* 34.4*  MCV 92.2 93.3 90.2 93.3 90.8  PLT 451* 407* 231 177 173   Cardiac Enzymes: No results found for this basename: CKTOTAL:5,CKMB:5,CKMBINDEX:5,TROPONINI:5 in the last 168 hours BNP: BNP (last 3 results) No results found for this basename: PROBNP:3 in the last 8760 hours CBG:  Lab 05/01/12 1126 05/01/12 0730 04/30/12 2200 04/30/12 1634 04/30/12 1122  GLUCAP 174* 152* 241* 136* 246*       Signed:  Regie Bunner A  [facility] 05/01/2012, 1:25 PM

## 2012-05-01 NOTE — Progress Notes (Signed)
Physical Therapy Treatment Patient Details Name: Anthony Strickland MRN: [id number] DOB: 10/10/1945 Today's Date: 05/01/2012 Time: 8657-8469 PT Time Calculation (min): 18 min  PT Assessment / Plan / Recommendation Comments on Treatment Session  Agreeable to therex despite quite a lot of pain; Pt educated on improtance of acheiving full knee extension postop, no pillows under knee; verbalized understanding    Follow Up Recommendations  SNF     Does the patient have the potential to tolerate intense rehabilitation     Barriers to Discharge        Equipment Recommendations  Wheelchair (measurements PT)    Recommendations for Other Services OT consult  Frequency Min 3X/week   Plan Discharge plan remains appropriate;Frequency remains appropriate    Precautions / Restrictions Precautions Precautions: Fall   Pertinent Vitals/Pain Still willing to work with PT despite 10/10 pain; was premedicated for pain    Mobility       Exercises Total Joint Exercises Ankle Circles/Pumps: AROM;Both;10 reps;Supine Quad Sets: AROM;Right;20 reps;Supine Short Arc Quad: AAROM;Right;5 reps;Supine Heel Slides: AAROM;Right;10 reps;Supine Straight Leg Raises: AAROM;Right;10 reps;Supine   PT Diagnosis:    PT Problem List:   PT Treatment Interventions:     PT Goals Acute Rehab PT Goals Time For Goal Achievement: 05/09/12 Potential to Achieve Goals: Fair Pt will Perform Home Exercise Program: with supervision, verbal cues required/provided PT Goal: Perform Home Exercise Program - Progress: Goal set today  Visit Information  Last PT Received On: 05/01/12 Assistance Needed:  (+1 therex)    Subjective Data  Subjective: Agreeable to therex only   Cognition  Overall Cognitive Status: Appears within functional limits for tasks assessed/performed Arousal/Alertness: Awake/alert Orientation Level: Appears intact for tasks assessed Behavior During Session: [facility] for tasks performed    Balance     End  of Session PT - End of Session Activity Tolerance: Patient limited by pain Patient left: in bed;with call bell/phone within reach Nurse Communication: Mobility status   GP     Van Clines [facility] [place], [place] [phone]  05/01/2012, 4:47 PM

## 2012-05-01 NOTE — Progress Notes (Signed)
ANTICOAGULATION CONSULT NOTE - Follow Up Consult  Pharmacy Consult for coumadin Indication: Acute DVT  No Known Allergies  Patient Measurements: Height: 5\' 9"  (175.3 cm) Weight: 358 lb 7.5 oz (162.6 kg) IBW/kg (Calculated) : 70.7    Vital Signs: Temp: 99.3 F (37.4 C) (12/26 0603) Temp src: Oral (12/26 0603) BP: 146/64 mmHg (12/26 0603) Pulse Rate: 82  (12/26 0603)  Labs:  Basename 05/01/12 0517 04/30/12 1533 04/30/12 0500 04/29/12 0500 04/28/12 1050  HGB -- 10.9* 11.4* -- --  HCT -- 33.1* 34.6* -- 34.2*  PLT -- 451* 407* -- 231  APTT -- -- -- -- --  LABPROT 20.7* -- 22.4* 23.3* --  INR 1.85* -- 2.06* 2.18* --  HEPARINUNFRC -- -- -- -- --  CREATININE 0.88 -- 0.82 0.83 --  CKTOTAL -- -- -- -- --  CKMB -- -- -- -- --  TROPONINI -- -- -- -- --    Estimated Creatinine Clearance: 127.2 ml/min (by C-G formula based on Cr of 0.88).  Assessment: Patient is a 66 y.o M on coumadin for DVT with this admit.  Difficult to determine maintenance coumadin dose at this time d/t labile INR.  INR's slightly subtherapeutic today at 1.85 from 2.06 despite dose increased to 4mg  yesterday.  No bleeding noted.  Goal of Therapy:  INR 2-3    Plan:  1. Coumadin 5mg  PO x1 2. Follow-up AM INR  Doris Gruhn P 05/01/2012,8:47 AM

## 2012-05-01 NOTE — Progress Notes (Deleted)
Placed patient on CPAP QHS with full face mask, autotitration and humidity

## 2012-05-01 NOTE — Progress Notes (Signed)
Patient refused CPAP at this time.  Patient stated that he knew how to put himself on CPAP and would place himself on CPAP when he was ready.

## 2012-05-02 DIAGNOSIS — R609 Edema, unspecified: Secondary | ICD-10-CM | POA: Diagnosis not present

## 2012-05-02 DIAGNOSIS — N390 Urinary tract infection, site not specified: Secondary | ICD-10-CM | POA: Diagnosis not present

## 2012-05-02 DIAGNOSIS — M25559 Pain in unspecified hip: Secondary | ICD-10-CM | POA: Diagnosis not present

## 2012-05-02 DIAGNOSIS — J45909 Unspecified asthma, uncomplicated: Secondary | ICD-10-CM | POA: Diagnosis not present

## 2012-05-02 DIAGNOSIS — E7800 Pure hypercholesterolemia, unspecified: Secondary | ICD-10-CM | POA: Diagnosis not present

## 2012-05-02 DIAGNOSIS — I10 Essential (primary) hypertension: Secondary | ICD-10-CM | POA: Diagnosis not present

## 2012-05-02 DIAGNOSIS — G4700 Insomnia, unspecified: Secondary | ICD-10-CM | POA: Diagnosis not present

## 2012-05-02 DIAGNOSIS — I80299 Phlebitis and thrombophlebitis of other deep vessels of unspecified lower extremity: Secondary | ICD-10-CM | POA: Diagnosis not present

## 2012-05-02 DIAGNOSIS — F3289 Other specified depressive episodes: Secondary | ICD-10-CM | POA: Diagnosis not present

## 2012-05-02 DIAGNOSIS — M25569 Pain in unspecified knee: Secondary | ICD-10-CM | POA: Diagnosis not present

## 2012-05-02 DIAGNOSIS — R7881 Bacteremia: Secondary | ICD-10-CM | POA: Diagnosis not present

## 2012-05-02 DIAGNOSIS — M79609 Pain in unspecified limb: Secondary | ICD-10-CM | POA: Diagnosis not present

## 2012-05-02 DIAGNOSIS — G8929 Other chronic pain: Secondary | ICD-10-CM | POA: Diagnosis not present

## 2012-05-02 DIAGNOSIS — B951 Streptococcus, group B, as the cause of diseases classified elsewhere: Secondary | ICD-10-CM | POA: Diagnosis not present

## 2012-05-02 DIAGNOSIS — K5900 Constipation, unspecified: Secondary | ICD-10-CM | POA: Diagnosis not present

## 2012-05-02 DIAGNOSIS — N400 Enlarged prostate without lower urinary tract symptoms: Secondary | ICD-10-CM | POA: Diagnosis not present

## 2012-05-02 DIAGNOSIS — Z5189 Encounter for other specified aftercare: Secondary | ICD-10-CM | POA: Diagnosis not present

## 2012-05-02 DIAGNOSIS — B351 Tinea unguium: Secondary | ICD-10-CM | POA: Diagnosis not present

## 2012-05-02 DIAGNOSIS — A409 Streptococcal sepsis, unspecified: Secondary | ICD-10-CM | POA: Diagnosis not present

## 2012-05-02 DIAGNOSIS — M009 Pyogenic arthritis, unspecified: Secondary | ICD-10-CM | POA: Diagnosis not present

## 2012-05-02 DIAGNOSIS — A419 Sepsis, unspecified organism: Secondary | ICD-10-CM | POA: Diagnosis not present

## 2012-05-02 DIAGNOSIS — E785 Hyperlipidemia, unspecified: Secondary | ICD-10-CM | POA: Diagnosis not present

## 2012-05-02 DIAGNOSIS — I82409 Acute embolism and thrombosis of unspecified deep veins of unspecified lower extremity: Secondary | ICD-10-CM | POA: Diagnosis not present

## 2012-05-02 LAB — CULTURE, BLOOD (ROUTINE X 2)
Culture: NO GROWTH
Culture: NO GROWTH

## 2012-05-02 LAB — PROTIME-INR: INR: 1.99 — ABNORMAL HIGH (ref 0.00–1.49)

## 2012-05-02 LAB — GLUCOSE, CAPILLARY: Glucose-Capillary: 168 mg/dL — ABNORMAL HIGH (ref 70–99)

## 2012-05-02 MED ORDER — WARFARIN SODIUM 5 MG PO TABS
5.0000 mg | ORAL_TABLET | Freq: Once | ORAL | Status: DC
  Filled 2012-05-02: qty 1

## 2012-05-02 NOTE — Progress Notes (Signed)
[id number]. Report given to receiving RN at [facility] place. Reported that patient has excoriation on his gluteal fold due to loose stools develop re: antibiotic ; negative c diff. EPC cream placed.

## 2012-05-02 NOTE — Progress Notes (Signed)
2 sutures removed from right knee - sutures intact upon removal. Patient tolerated removal without difficulty.

## 2012-05-02 NOTE — Progress Notes (Signed)
The patient says he does not want to wear his CPAP tonight.

## 2012-05-02 NOTE — Progress Notes (Signed)
ANTICOAGULATION CONSULT NOTE - Follow Up Consult  Pharmacy Consult for coumadin Indication: DVT  No Known Allergies  Patient Measurements: Height: 5\' 9"  (175.3 cm) Weight: 330 lb 9.6 oz (149.959 kg) IBW/kg (Calculated) : 70.7    Vital Signs: Temp: 98 F (36.7 C) (12/27 0944) Temp src: Oral (12/27 0944) BP: 123/75 mmHg (12/27 0944) Pulse Rate: 90  (12/27 0944)  Labs:  Basename 05/02/12 0600 05/01/12 0517 04/30/12 1533 04/30/12 0500  HGB -- -- 10.9* 11.4*  HCT -- -- 33.1* 34.6*  PLT -- -- 451* 407*  APTT -- -- -- --  LABPROT 21.8* 20.7* -- 22.4*  INR 1.99* 1.85* -- 2.06*  HEPARINUNFRC -- -- -- --  CREATININE -- 0.88 -- 0.82  CKTOTAL -- -- -- --  CKMB -- -- -- --  TROPONINI -- -- -- --    Estimated Creatinine Clearance: 121.2 ml/min (by C-G formula based on Cr of 0.88).  Assessment: Patient is a 66 y.o M on coumadin for new DVT.  INR now increased from 1.85 to 1.99 after 5mg  dose given last night.  Goal of Therapy:  INR 2-3   Plan:  1) Repeat coumadin 5mg  PO x1 today 2) f/u with patient in AM if still here  Karon Cotterill P 05/02/2012,10:12 AM

## 2012-05-09 DIAGNOSIS — R609 Edema, unspecified: Secondary | ICD-10-CM | POA: Diagnosis not present

## 2012-05-09 DIAGNOSIS — I80299 Phlebitis and thrombophlebitis of other deep vessels of unspecified lower extremity: Secondary | ICD-10-CM | POA: Diagnosis not present

## 2012-05-09 DIAGNOSIS — M009 Pyogenic arthritis, unspecified: Secondary | ICD-10-CM | POA: Diagnosis not present

## 2012-05-09 DIAGNOSIS — M25569 Pain in unspecified knee: Secondary | ICD-10-CM | POA: Diagnosis not present

## 2012-05-21 DIAGNOSIS — M009 Pyogenic arthritis, unspecified: Secondary | ICD-10-CM | POA: Diagnosis not present

## 2012-05-23 DIAGNOSIS — M25559 Pain in unspecified hip: Secondary | ICD-10-CM | POA: Diagnosis not present

## 2012-05-26 DIAGNOSIS — M25559 Pain in unspecified hip: Secondary | ICD-10-CM | POA: Diagnosis not present

## 2012-05-29 ENCOUNTER — Encounter: Payer: Self-pay | Admitting: Infectious Disease

## 2012-05-29 ENCOUNTER — Ambulatory Visit (INDEPENDENT_AMBULATORY_CARE_PROVIDER_SITE_OTHER): Payer: Medicare Other | Admitting: Infectious Disease

## 2012-05-29 VITALS — BP 119/80 | HR 86 | Temp 98.1°F | Ht 69.0 in | Wt 311.0 lb

## 2012-05-29 DIAGNOSIS — A419 Sepsis, unspecified organism: Secondary | ICD-10-CM

## 2012-05-29 DIAGNOSIS — M009 Pyogenic arthritis, unspecified: Secondary | ICD-10-CM

## 2012-05-29 DIAGNOSIS — A401 Sepsis due to streptococcus, group B: Secondary | ICD-10-CM

## 2012-05-29 DIAGNOSIS — A409 Streptococcal sepsis, unspecified: Secondary | ICD-10-CM | POA: Diagnosis not present

## 2012-05-29 DIAGNOSIS — B951 Streptococcus, group B, as the cause of diseases classified elsewhere: Secondary | ICD-10-CM | POA: Diagnosis not present

## 2012-05-29 NOTE — Progress Notes (Signed)
Subjective:    Patient ID: Anthony Strickland, male    DOB: 07-Jun-1945, 67 y.o.   MRN: [id number]  HPI  67 y.o. male withgroup B strep SEpic arthritis and highly likely concommittant GBS bacteremia and septic shock  Sp I and D by Dr Luiz Blare on [phone] sp echo without vegetations and repeat blood cutures negative now on course to complete 6 weeks of IV rocephin. The patient states his knee pain is dramatically improved compared with the hospital. He is engaged in more physical therapy and walking now. He has still limited motion and still significant pain and is wearing a lidocaine patch over his knee. He himself is also still warm with slight effusion. He's been back to see Dr. Luiz Blare approximately 2 weeks ago he was happy with his progress. We will check a sedimentation rate and C-reactive protein today with some basic labs and make sure that he finishes his 6 week course of therapy with followup with [place] shortly thereafter. I spent greater than 45 minutes with the patient including greater than 50% of time in face to face counsel of the patient and in coordination of their care.   Review of Systems  Constitutional: Negative for fever, chills, diaphoresis, activity change, appetite change, fatigue and unexpected weight change.  HENT: Negative for congestion, sore throat, rhinorrhea, sneezing, trouble swallowing and sinus pressure.   Eyes: Negative for photophobia and visual disturbance.  Respiratory: Negative for cough, chest tightness, shortness of breath, wheezing and stridor.   Cardiovascular: Negative for chest pain, palpitations and leg swelling.  Gastrointestinal: Negative for nausea, vomiting, abdominal pain, diarrhea, constipation, blood in stool, abdominal distention and anal bleeding.  Genitourinary: Negative for dysuria, hematuria, flank pain and difficulty urinating.  Musculoskeletal: Positive for joint swelling and arthralgias. Negative for myalgias, back pain and gait problem.  Skin:  Negative for color change, pallor, rash and wound.  Neurological: Negative for dizziness, tremors, weakness and light-headedness.  Hematological: Negative for adenopathy. Does not bruise/bleed easily.  Psychiatric/Behavioral: Negative for behavioral problems, confusion, sleep disturbance, dysphoric mood, decreased concentration and agitation.       Objective:   Physical Exam  Constitutional: He is oriented to person, place, and time. He appears well-developed and well-nourished. No distress.  HENT:  Head: Normocephalic and atraumatic.  Mouth/Throat: Oropharynx is clear and moist. No oropharyngeal exudate.  Eyes: Conjunctivae normal and EOM are normal. Pupils are equal, round, and reactive to light. No scleral icterus.  Neck: Normal range of motion. Neck supple. No JVD present.  Cardiovascular: Normal rate, regular rhythm and normal heart sounds.  Exam reveals no gallop and no friction rub.   No murmur heard. Pulmonary/Chest: Effort normal and breath sounds normal. No respiratory distress. He has no wheezes. He has no rales. He exhibits no tenderness.  Abdominal: He exhibits no distension and no mass. There is no tenderness. There is no rebound and no guarding.  Musculoskeletal:       Right knee: He exhibits decreased range of motion, swelling and effusion.       Legs: Lymphadenopathy:    He has no cervical adenopathy.  Neurological: He is alert and oriented to person, place, and time. He has normal reflexes.  Skin: Skin is warm and dry. He is not diaphoretic. No erythema. No pallor.  Psychiatric: He has a normal mood and affect. His behavior is normal. Judgment and thought content normal.          Assessment & Plan:  Group B streptococcal sepsis and septic arthritis: sp  surgery and now approaching 6 weeks of therapy, We will ask her mentation rate C-reactive protein-course of therapy. We'll see him back proximally 2 weeks after finishes antibiotics.

## 2012-05-30 LAB — SEDIMENTATION RATE: Sed Rate: 27 mm/hr — ABNORMAL HIGH (ref 0–16)

## 2012-06-09 DIAGNOSIS — M25559 Pain in unspecified hip: Secondary | ICD-10-CM | POA: Diagnosis not present

## 2012-06-10 DIAGNOSIS — J45909 Unspecified asthma, uncomplicated: Secondary | ICD-10-CM | POA: Diagnosis not present

## 2012-06-10 DIAGNOSIS — E7800 Pure hypercholesterolemia, unspecified: Secondary | ICD-10-CM | POA: Diagnosis not present

## 2012-06-10 DIAGNOSIS — M009 Pyogenic arthritis, unspecified: Secondary | ICD-10-CM | POA: Diagnosis not present

## 2012-06-10 DIAGNOSIS — I10 Essential (primary) hypertension: Secondary | ICD-10-CM | POA: Diagnosis not present

## 2012-06-10 DIAGNOSIS — K5900 Constipation, unspecified: Secondary | ICD-10-CM | POA: Diagnosis not present

## 2012-06-10 DIAGNOSIS — N400 Enlarged prostate without lower urinary tract symptoms: Secondary | ICD-10-CM | POA: Diagnosis not present

## 2012-06-11 DIAGNOSIS — M009 Pyogenic arthritis, unspecified: Secondary | ICD-10-CM | POA: Diagnosis not present

## 2012-06-12 DIAGNOSIS — M25559 Pain in unspecified hip: Secondary | ICD-10-CM | POA: Diagnosis not present

## 2012-06-24 ENCOUNTER — Encounter: Payer: Self-pay | Admitting: Infectious Disease

## 2012-06-24 ENCOUNTER — Ambulatory Visit (INDEPENDENT_AMBULATORY_CARE_PROVIDER_SITE_OTHER): Payer: Medicare Other | Admitting: Infectious Disease

## 2012-06-24 VITALS — BP 111/66 | HR 74 | Temp 98.3°F | Ht 69.0 in | Wt 303.0 lb

## 2012-06-24 DIAGNOSIS — R7881 Bacteremia: Secondary | ICD-10-CM

## 2012-06-24 DIAGNOSIS — M009 Pyogenic arthritis, unspecified: Secondary | ICD-10-CM

## 2012-06-24 DIAGNOSIS — M25559 Pain in unspecified hip: Secondary | ICD-10-CM | POA: Diagnosis not present

## 2012-06-24 DIAGNOSIS — A491 Streptococcal infection, unspecified site: Secondary | ICD-10-CM

## 2012-06-24 DIAGNOSIS — B951 Streptococcus, group B, as the cause of diseases classified elsewhere: Secondary | ICD-10-CM

## 2012-06-24 NOTE — Progress Notes (Signed)
  Subjective:    Patient ID: Anthony Strickland, male    DOB: Jan 22, 1946, 67 y.o.   MRN: [id number]  HPI  67 y.o. male withgroup B strep SEpic arthritis and highly likely concommittant GBS bacteremia and septic shock Sp I and D by Dr Luiz Blare on [phone] sp echo without vegetations and repeat blood cutures negative who completed 6 weeks of IV rocephin. Loss of the patient his sedimentation rate C-reactive protein at 10 try to down nicely. His knee pain and dramatically improved. Today he still has residual knee pain largely with weightbearing and ambulation but also still some at rest. No fevers chills nausea or malaise or other symptoms .  Review of Systems  Constitutional: Negative for fever, chills, diaphoresis, activity change, appetite change, fatigue and unexpected weight change.  HENT: Negative for congestion, sore throat, rhinorrhea, sneezing, trouble swallowing and sinus pressure.   Eyes: Negative for photophobia and visual disturbance.  Respiratory: Negative for cough, chest tightness, shortness of breath, wheezing and stridor.   Cardiovascular: Negative for chest pain, palpitations and leg swelling.  Gastrointestinal: Negative for nausea, vomiting, abdominal pain, diarrhea, constipation, blood in stool, abdominal distention and anal bleeding.  Genitourinary: Negative for dysuria, hematuria, flank pain and difficulty urinating.  Musculoskeletal: Positive for joint swelling and arthralgias. Negative for myalgias, back pain and gait problem.  Skin: Negative for color change, pallor, rash and wound.  Neurological: Negative for dizziness, tremors, weakness and light-headedness.  Hematological: Negative for adenopathy. Does not bruise/bleed easily.  Psychiatric/Behavioral: Negative for behavioral problems, confusion, sleep disturbance, dysphoric mood, decreased concentration and agitation.       Objective:   Physical Exam  Constitutional: He is oriented to person, place, and time. He appears  well-developed and well-nourished. No distress.  HENT:  Head: Normocephalic and atraumatic.  Mouth/Throat: Oropharynx is clear and moist. No oropharyngeal exudate.  Eyes: Conjunctivae and EOM are normal. Pupils are equal, round, and reactive to light. No scleral icterus.  Neck: Normal range of motion. Neck supple. No JVD present.  Cardiovascular: Normal rate, regular rhythm and normal heart sounds.  Exam reveals no gallop and no friction rub.   No murmur heard. Pulmonary/Chest: Effort normal and breath sounds normal. No respiratory distress. He has no wheezes. He has no rales. He exhibits no tenderness.  Abdominal: He exhibits no distension and no mass. There is no tenderness. There is no rebound and no guarding.  Musculoskeletal: He exhibits no edema and no tenderness.       Right knee: He exhibits swelling and effusion. No tenderness found.  Lymphadenopathy:    He has no cervical adenopathy.  Neurological: He is alert and oriented to person, place, and time. He has normal reflexes. He exhibits normal muscle tone. Coordination normal.  Skin: Skin is warm and dry. He is not diaphoretic. No erythema. No pallor.  Psychiatric: He has a normal mood and affect. His behavior is normal. Judgment and thought content normal.          Assessment & Plan:  Group B streptococcal bacteremia: Patient has received more than 6 weeks of effective antibacterial antibiotics.  Group B streptococcal septic joint: Patient received more than adequate course of therapy his symptoms and sedimentation rate and C-reactive protein trajectory R. lawn with the patient has been successfully curative infection. We will observe him off antibiotics and see him back in 2 months time.

## 2012-06-30 DIAGNOSIS — M25559 Pain in unspecified hip: Secondary | ICD-10-CM | POA: Diagnosis not present

## 2012-07-03 DIAGNOSIS — M25559 Pain in unspecified hip: Secondary | ICD-10-CM | POA: Diagnosis not present

## 2012-07-06 DIAGNOSIS — I82409 Acute embolism and thrombosis of unspecified deep veins of unspecified lower extremity: Secondary | ICD-10-CM | POA: Diagnosis not present

## 2012-07-06 DIAGNOSIS — I10 Essential (primary) hypertension: Secondary | ICD-10-CM | POA: Diagnosis not present

## 2012-07-06 DIAGNOSIS — Z5181 Encounter for therapeutic drug level monitoring: Secondary | ICD-10-CM | POA: Diagnosis not present

## 2012-07-06 DIAGNOSIS — R262 Difficulty in walking, not elsewhere classified: Secondary | ICD-10-CM | POA: Diagnosis not present

## 2012-07-06 DIAGNOSIS — J45909 Unspecified asthma, uncomplicated: Secondary | ICD-10-CM | POA: Diagnosis not present

## 2012-07-06 DIAGNOSIS — Z87440 Personal history of urinary (tract) infections: Secondary | ICD-10-CM | POA: Diagnosis not present

## 2012-07-06 DIAGNOSIS — M069 Rheumatoid arthritis, unspecified: Secondary | ICD-10-CM | POA: Diagnosis not present

## 2012-07-06 DIAGNOSIS — Z7901 Long term (current) use of anticoagulants: Secondary | ICD-10-CM | POA: Diagnosis not present

## 2012-07-06 DIAGNOSIS — E119 Type 2 diabetes mellitus without complications: Secondary | ICD-10-CM | POA: Diagnosis not present

## 2012-07-07 DIAGNOSIS — R262 Difficulty in walking, not elsewhere classified: Secondary | ICD-10-CM | POA: Diagnosis not present

## 2012-07-07 DIAGNOSIS — I10 Essential (primary) hypertension: Secondary | ICD-10-CM | POA: Diagnosis not present

## 2012-07-07 DIAGNOSIS — M069 Rheumatoid arthritis, unspecified: Secondary | ICD-10-CM | POA: Diagnosis not present

## 2012-07-07 DIAGNOSIS — I82409 Acute embolism and thrombosis of unspecified deep veins of unspecified lower extremity: Secondary | ICD-10-CM | POA: Diagnosis not present

## 2012-07-07 DIAGNOSIS — E119 Type 2 diabetes mellitus without complications: Secondary | ICD-10-CM | POA: Diagnosis not present

## 2012-07-07 DIAGNOSIS — J45909 Unspecified asthma, uncomplicated: Secondary | ICD-10-CM | POA: Diagnosis not present

## 2012-07-08 DIAGNOSIS — R262 Difficulty in walking, not elsewhere classified: Secondary | ICD-10-CM | POA: Diagnosis not present

## 2012-07-08 DIAGNOSIS — I10 Essential (primary) hypertension: Secondary | ICD-10-CM | POA: Diagnosis not present

## 2012-07-08 DIAGNOSIS — E119 Type 2 diabetes mellitus without complications: Secondary | ICD-10-CM | POA: Diagnosis not present

## 2012-07-08 DIAGNOSIS — M069 Rheumatoid arthritis, unspecified: Secondary | ICD-10-CM | POA: Diagnosis not present

## 2012-07-08 DIAGNOSIS — J45909 Unspecified asthma, uncomplicated: Secondary | ICD-10-CM | POA: Diagnosis not present

## 2012-07-08 DIAGNOSIS — I82409 Acute embolism and thrombosis of unspecified deep veins of unspecified lower extremity: Secondary | ICD-10-CM | POA: Diagnosis not present

## 2012-07-09 DIAGNOSIS — R262 Difficulty in walking, not elsewhere classified: Secondary | ICD-10-CM | POA: Diagnosis not present

## 2012-07-09 DIAGNOSIS — E119 Type 2 diabetes mellitus without complications: Secondary | ICD-10-CM | POA: Diagnosis not present

## 2012-07-09 DIAGNOSIS — I10 Essential (primary) hypertension: Secondary | ICD-10-CM | POA: Diagnosis not present

## 2012-07-09 DIAGNOSIS — I82409 Acute embolism and thrombosis of unspecified deep veins of unspecified lower extremity: Secondary | ICD-10-CM | POA: Diagnosis not present

## 2012-07-09 DIAGNOSIS — J45909 Unspecified asthma, uncomplicated: Secondary | ICD-10-CM | POA: Diagnosis not present

## 2012-07-09 DIAGNOSIS — M069 Rheumatoid arthritis, unspecified: Secondary | ICD-10-CM | POA: Diagnosis not present

## 2012-07-14 DIAGNOSIS — M069 Rheumatoid arthritis, unspecified: Secondary | ICD-10-CM | POA: Diagnosis not present

## 2012-07-14 DIAGNOSIS — I82409 Acute embolism and thrombosis of unspecified deep veins of unspecified lower extremity: Secondary | ICD-10-CM | POA: Diagnosis not present

## 2012-07-14 DIAGNOSIS — R262 Difficulty in walking, not elsewhere classified: Secondary | ICD-10-CM | POA: Diagnosis not present

## 2012-07-14 DIAGNOSIS — J45909 Unspecified asthma, uncomplicated: Secondary | ICD-10-CM | POA: Diagnosis not present

## 2012-07-14 DIAGNOSIS — I10 Essential (primary) hypertension: Secondary | ICD-10-CM | POA: Diagnosis not present

## 2012-07-14 DIAGNOSIS — E119 Type 2 diabetes mellitus without complications: Secondary | ICD-10-CM | POA: Diagnosis not present

## 2012-07-15 DIAGNOSIS — E119 Type 2 diabetes mellitus without complications: Secondary | ICD-10-CM | POA: Diagnosis not present

## 2012-07-15 DIAGNOSIS — I10 Essential (primary) hypertension: Secondary | ICD-10-CM | POA: Diagnosis not present

## 2012-07-15 DIAGNOSIS — M069 Rheumatoid arthritis, unspecified: Secondary | ICD-10-CM | POA: Diagnosis not present

## 2012-07-15 DIAGNOSIS — J45909 Unspecified asthma, uncomplicated: Secondary | ICD-10-CM | POA: Diagnosis not present

## 2012-07-15 DIAGNOSIS — I82409 Acute embolism and thrombosis of unspecified deep veins of unspecified lower extremity: Secondary | ICD-10-CM | POA: Diagnosis not present

## 2012-07-15 DIAGNOSIS — R262 Difficulty in walking, not elsewhere classified: Secondary | ICD-10-CM | POA: Diagnosis not present

## 2012-07-18 DIAGNOSIS — M069 Rheumatoid arthritis, unspecified: Secondary | ICD-10-CM | POA: Diagnosis not present

## 2012-07-18 DIAGNOSIS — E119 Type 2 diabetes mellitus without complications: Secondary | ICD-10-CM | POA: Diagnosis not present

## 2012-07-18 DIAGNOSIS — I10 Essential (primary) hypertension: Secondary | ICD-10-CM | POA: Diagnosis not present

## 2012-07-18 DIAGNOSIS — R262 Difficulty in walking, not elsewhere classified: Secondary | ICD-10-CM | POA: Diagnosis not present

## 2012-07-18 DIAGNOSIS — J45909 Unspecified asthma, uncomplicated: Secondary | ICD-10-CM | POA: Diagnosis not present

## 2012-07-18 DIAGNOSIS — I82409 Acute embolism and thrombosis of unspecified deep veins of unspecified lower extremity: Secondary | ICD-10-CM | POA: Diagnosis not present

## 2012-07-22 DIAGNOSIS — R262 Difficulty in walking, not elsewhere classified: Secondary | ICD-10-CM | POA: Diagnosis not present

## 2012-07-22 DIAGNOSIS — I82409 Acute embolism and thrombosis of unspecified deep veins of unspecified lower extremity: Secondary | ICD-10-CM | POA: Diagnosis not present

## 2012-07-22 DIAGNOSIS — E119 Type 2 diabetes mellitus without complications: Secondary | ICD-10-CM | POA: Diagnosis not present

## 2012-07-22 DIAGNOSIS — M069 Rheumatoid arthritis, unspecified: Secondary | ICD-10-CM | POA: Diagnosis not present

## 2012-07-22 DIAGNOSIS — I10 Essential (primary) hypertension: Secondary | ICD-10-CM | POA: Diagnosis not present

## 2012-07-22 DIAGNOSIS — J45909 Unspecified asthma, uncomplicated: Secondary | ICD-10-CM | POA: Diagnosis not present

## 2012-07-23 DIAGNOSIS — R262 Difficulty in walking, not elsewhere classified: Secondary | ICD-10-CM | POA: Diagnosis not present

## 2012-07-23 DIAGNOSIS — E119 Type 2 diabetes mellitus without complications: Secondary | ICD-10-CM | POA: Diagnosis not present

## 2012-07-23 DIAGNOSIS — M069 Rheumatoid arthritis, unspecified: Secondary | ICD-10-CM | POA: Diagnosis not present

## 2012-07-23 DIAGNOSIS — I82409 Acute embolism and thrombosis of unspecified deep veins of unspecified lower extremity: Secondary | ICD-10-CM | POA: Diagnosis not present

## 2012-07-23 DIAGNOSIS — J45909 Unspecified asthma, uncomplicated: Secondary | ICD-10-CM | POA: Diagnosis not present

## 2012-07-23 DIAGNOSIS — I10 Essential (primary) hypertension: Secondary | ICD-10-CM | POA: Diagnosis not present

## 2012-07-24 ENCOUNTER — Telehealth: Payer: Self-pay | Admitting: *Deleted

## 2012-07-24 ENCOUNTER — Ambulatory Visit: Payer: Self-pay | Admitting: *Deleted

## 2012-07-24 DIAGNOSIS — I82409 Acute embolism and thrombosis of unspecified deep veins of unspecified lower extremity: Secondary | ICD-10-CM | POA: Diagnosis not present

## 2012-07-24 DIAGNOSIS — M069 Rheumatoid arthritis, unspecified: Secondary | ICD-10-CM | POA: Diagnosis not present

## 2012-07-24 DIAGNOSIS — J45909 Unspecified asthma, uncomplicated: Secondary | ICD-10-CM | POA: Diagnosis not present

## 2012-07-24 DIAGNOSIS — R262 Difficulty in walking, not elsewhere classified: Secondary | ICD-10-CM | POA: Diagnosis not present

## 2012-07-24 DIAGNOSIS — E119 Type 2 diabetes mellitus without complications: Secondary | ICD-10-CM | POA: Diagnosis not present

## 2012-07-24 DIAGNOSIS — I10 Essential (primary) hypertension: Secondary | ICD-10-CM | POA: Diagnosis not present

## 2012-07-24 NOTE — Telephone Encounter (Signed)
[facility] )  Coumadin 5 MG once a day  07/24/2012 Patient PT 23.2 INR 2.3    Per Dr Renato Gails continue same dose and repeat in one month. [facility]y and patient has been notified

## 2012-07-26 DIAGNOSIS — E119 Type 2 diabetes mellitus without complications: Secondary | ICD-10-CM | POA: Diagnosis not present

## 2012-07-26 DIAGNOSIS — R262 Difficulty in walking, not elsewhere classified: Secondary | ICD-10-CM | POA: Diagnosis not present

## 2012-07-26 DIAGNOSIS — J45909 Unspecified asthma, uncomplicated: Secondary | ICD-10-CM | POA: Diagnosis not present

## 2012-07-26 DIAGNOSIS — I82409 Acute embolism and thrombosis of unspecified deep veins of unspecified lower extremity: Secondary | ICD-10-CM | POA: Diagnosis not present

## 2012-07-26 DIAGNOSIS — M069 Rheumatoid arthritis, unspecified: Secondary | ICD-10-CM | POA: Diagnosis not present

## 2012-07-26 DIAGNOSIS — I10 Essential (primary) hypertension: Secondary | ICD-10-CM | POA: Diagnosis not present

## 2012-07-29 DIAGNOSIS — M069 Rheumatoid arthritis, unspecified: Secondary | ICD-10-CM | POA: Diagnosis not present

## 2012-07-29 DIAGNOSIS — I82409 Acute embolism and thrombosis of unspecified deep veins of unspecified lower extremity: Secondary | ICD-10-CM | POA: Diagnosis not present

## 2012-07-29 DIAGNOSIS — J45909 Unspecified asthma, uncomplicated: Secondary | ICD-10-CM | POA: Diagnosis not present

## 2012-07-29 DIAGNOSIS — E119 Type 2 diabetes mellitus without complications: Secondary | ICD-10-CM | POA: Diagnosis not present

## 2012-07-29 DIAGNOSIS — I10 Essential (primary) hypertension: Secondary | ICD-10-CM | POA: Diagnosis not present

## 2012-07-29 DIAGNOSIS — R262 Difficulty in walking, not elsewhere classified: Secondary | ICD-10-CM | POA: Diagnosis not present

## 2012-07-30 DIAGNOSIS — J45909 Unspecified asthma, uncomplicated: Secondary | ICD-10-CM | POA: Diagnosis not present

## 2012-07-30 DIAGNOSIS — M069 Rheumatoid arthritis, unspecified: Secondary | ICD-10-CM | POA: Diagnosis not present

## 2012-07-30 DIAGNOSIS — R262 Difficulty in walking, not elsewhere classified: Secondary | ICD-10-CM | POA: Diagnosis not present

## 2012-07-30 DIAGNOSIS — E119 Type 2 diabetes mellitus without complications: Secondary | ICD-10-CM | POA: Diagnosis not present

## 2012-07-30 DIAGNOSIS — I82409 Acute embolism and thrombosis of unspecified deep veins of unspecified lower extremity: Secondary | ICD-10-CM | POA: Diagnosis not present

## 2012-07-30 DIAGNOSIS — I10 Essential (primary) hypertension: Secondary | ICD-10-CM | POA: Diagnosis not present

## 2012-07-31 DIAGNOSIS — I10 Essential (primary) hypertension: Secondary | ICD-10-CM | POA: Diagnosis not present

## 2012-07-31 DIAGNOSIS — I82409 Acute embolism and thrombosis of unspecified deep veins of unspecified lower extremity: Secondary | ICD-10-CM | POA: Diagnosis not present

## 2012-07-31 DIAGNOSIS — J45909 Unspecified asthma, uncomplicated: Secondary | ICD-10-CM | POA: Diagnosis not present

## 2012-07-31 DIAGNOSIS — R262 Difficulty in walking, not elsewhere classified: Secondary | ICD-10-CM | POA: Diagnosis not present

## 2012-07-31 DIAGNOSIS — M069 Rheumatoid arthritis, unspecified: Secondary | ICD-10-CM | POA: Diagnosis not present

## 2012-07-31 DIAGNOSIS — E119 Type 2 diabetes mellitus without complications: Secondary | ICD-10-CM | POA: Diagnosis not present

## 2012-08-01 DIAGNOSIS — R262 Difficulty in walking, not elsewhere classified: Secondary | ICD-10-CM | POA: Diagnosis not present

## 2012-08-01 DIAGNOSIS — J45909 Unspecified asthma, uncomplicated: Secondary | ICD-10-CM | POA: Diagnosis not present

## 2012-08-01 DIAGNOSIS — I82409 Acute embolism and thrombosis of unspecified deep veins of unspecified lower extremity: Secondary | ICD-10-CM | POA: Diagnosis not present

## 2012-08-01 DIAGNOSIS — E119 Type 2 diabetes mellitus without complications: Secondary | ICD-10-CM | POA: Diagnosis not present

## 2012-08-01 DIAGNOSIS — M069 Rheumatoid arthritis, unspecified: Secondary | ICD-10-CM | POA: Diagnosis not present

## 2012-08-01 DIAGNOSIS — I10 Essential (primary) hypertension: Secondary | ICD-10-CM | POA: Diagnosis not present

## 2012-08-04 DIAGNOSIS — R262 Difficulty in walking, not elsewhere classified: Secondary | ICD-10-CM | POA: Diagnosis not present

## 2012-08-04 DIAGNOSIS — I10 Essential (primary) hypertension: Secondary | ICD-10-CM | POA: Diagnosis not present

## 2012-08-04 DIAGNOSIS — E119 Type 2 diabetes mellitus without complications: Secondary | ICD-10-CM | POA: Diagnosis not present

## 2012-08-04 DIAGNOSIS — I82409 Acute embolism and thrombosis of unspecified deep veins of unspecified lower extremity: Secondary | ICD-10-CM | POA: Diagnosis not present

## 2012-08-04 DIAGNOSIS — J45909 Unspecified asthma, uncomplicated: Secondary | ICD-10-CM | POA: Diagnosis not present

## 2012-08-04 DIAGNOSIS — M069 Rheumatoid arthritis, unspecified: Secondary | ICD-10-CM | POA: Diagnosis not present

## 2012-08-06 DIAGNOSIS — I10 Essential (primary) hypertension: Secondary | ICD-10-CM | POA: Diagnosis not present

## 2012-08-06 DIAGNOSIS — R262 Difficulty in walking, not elsewhere classified: Secondary | ICD-10-CM | POA: Diagnosis not present

## 2012-08-06 DIAGNOSIS — M069 Rheumatoid arthritis, unspecified: Secondary | ICD-10-CM | POA: Diagnosis not present

## 2012-08-06 DIAGNOSIS — J45909 Unspecified asthma, uncomplicated: Secondary | ICD-10-CM | POA: Diagnosis not present

## 2012-08-06 DIAGNOSIS — E119 Type 2 diabetes mellitus without complications: Secondary | ICD-10-CM | POA: Diagnosis not present

## 2012-08-06 DIAGNOSIS — I82409 Acute embolism and thrombosis of unspecified deep veins of unspecified lower extremity: Secondary | ICD-10-CM | POA: Diagnosis not present

## 2012-08-07 DIAGNOSIS — R262 Difficulty in walking, not elsewhere classified: Secondary | ICD-10-CM | POA: Diagnosis not present

## 2012-08-07 DIAGNOSIS — I82409 Acute embolism and thrombosis of unspecified deep veins of unspecified lower extremity: Secondary | ICD-10-CM | POA: Diagnosis not present

## 2012-08-07 DIAGNOSIS — J45909 Unspecified asthma, uncomplicated: Secondary | ICD-10-CM | POA: Diagnosis not present

## 2012-08-07 DIAGNOSIS — I10 Essential (primary) hypertension: Secondary | ICD-10-CM | POA: Diagnosis not present

## 2012-08-07 DIAGNOSIS — M069 Rheumatoid arthritis, unspecified: Secondary | ICD-10-CM | POA: Diagnosis not present

## 2012-08-07 DIAGNOSIS — E119 Type 2 diabetes mellitus without complications: Secondary | ICD-10-CM | POA: Diagnosis not present

## 2012-08-19 ENCOUNTER — Ambulatory Visit (INDEPENDENT_AMBULATORY_CARE_PROVIDER_SITE_OTHER): Payer: Medicare Other | Admitting: Nurse Practitioner

## 2012-08-19 ENCOUNTER — Encounter: Payer: Self-pay | Admitting: Nurse Practitioner

## 2012-08-19 VITALS — BP 128/84 | HR 79 | Temp 98.0°F | Resp 15 | Ht 67.25 in | Wt 313.8 lb

## 2012-08-19 DIAGNOSIS — M25569 Pain in unspecified knee: Secondary | ICD-10-CM | POA: Diagnosis not present

## 2012-08-19 DIAGNOSIS — E119 Type 2 diabetes mellitus without complications: Secondary | ICD-10-CM

## 2012-08-19 DIAGNOSIS — I82409 Acute embolism and thrombosis of unspecified deep veins of unspecified lower extremity: Secondary | ICD-10-CM | POA: Diagnosis not present

## 2012-08-19 DIAGNOSIS — I82401 Acute embolism and thrombosis of unspecified deep veins of right lower extremity: Secondary | ICD-10-CM

## 2012-08-19 DIAGNOSIS — E785 Hyperlipidemia, unspecified: Secondary | ICD-10-CM

## 2012-08-19 DIAGNOSIS — M25561 Pain in right knee: Secondary | ICD-10-CM

## 2012-08-19 DIAGNOSIS — I10 Essential (primary) hypertension: Secondary | ICD-10-CM

## 2012-08-19 DIAGNOSIS — R21 Rash and other nonspecific skin eruption: Secondary | ICD-10-CM | POA: Diagnosis not present

## 2012-08-19 LAB — POCT INR: INR: 1.3

## 2012-08-19 MED ORDER — RIVAROXABAN 20 MG PO TABS: 20.0000 mg | ORAL_TABLET | Freq: Every day | ORAL | Status: DC

## 2012-08-19 NOTE — Progress Notes (Signed)
Patient ID: Anthony Strickland, male   DOB: 1946/01/31, 67 y.o.   MRN: [id number] Code Status: full code   No Known Allergies  Chief Complaint  Patient presents with  . NP to Establish    HPI: Patient is a 67 y.o. male seen in the office today to establish care Was previously being seen at the [place] and then he was hospitalized due to right knee septic arthritis and was in rehab at [facility]. After rehab he decided to establish with the practice.  During previous hospitalization (04/22/12) he was diagnosed with right leg DVT   Last INR checked 3 weeks ago with [facility].  Rash on bilateral palms, back, arms and chest; itching -- has been going on for 6 weeks. Tried benadryl and hydrocortisone without relief   Review of Systems:  Review of Systems  Constitutional: Positive for malaise/fatigue (fatigued easily ). Negative for fever, chills and weight loss.  HENT: Positive for ear discharge (bilateral drainage ). Negative for hearing loss, ear pain (3 weeks ago was treated for ear infection - did not take but 3 days of antibiotics ), nosebleeds, congestion, sore throat and tinnitus.   Eyes: Negative.        Sees eye MD regualarily  Respiratory: Positive for wheezing (at night). Negative for cough and shortness of breath.        Hx of sleep apnea- uses CPAP  Cardiovascular: Positive for claudication and leg swelling. Negative for chest pain, palpitations, orthopnea and PND.  Gastrointestinal: Positive for constipation (occasional constipation). Negative for heartburn, nausea, vomiting, abdominal pain and diarrhea.  Genitourinary: Positive for urgency. Negative for hematuria and flank pain.       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  Skin: Positive for rash (itchy rash on bilateral  palms and arms, and back and chest).  Neurological: Positive for tingling (right foot feels numb). Negative for headaches.  Endo/Heme/Allergies: Does not bruise/bleed easily.  Psychiatric/Behavioral: Positive for depression. The patient is nervous/anxious.      Past Medical History  Diagnosis Date  . Asthma   . Rheumatoid arthritis   . Hypertension   . Depression   . Hyperlipidemia   . DVT (deep venous thrombosis)   . History of recurrent UTIs   . PTSD (post-traumatic stress disorder)   . BPH (benign prostatic hyperplasia)   . CAD (coronary artery disease)    Past Surgical History  Procedure Laterality Date  . Coronary stent placement    . Knee arthroscopy  04/23/2012    Procedure: ARTHROSCOPY KNEE;  Surgeon: Mable Paris, MD;  Location: [facility] OR;  Service: Orthopedics;  Laterality: Right;  . Cardiac surgery  2009    stent placement   Social History:   reports that he has quit smoking. His smoking use included Cigarettes. He has a 3 pack-year smoking history. He has never used smokeless tobacco. He reports that he does not drink alcohol or use illicit drugs.  Family History  Problem Relation Age of Onset  . Hypertension Mother   . Other Father     Medications: Patient's Medications  New Prescriptions   No medications on file  Previous Medications   ASPIRIN 325 MG EC TABLET    Take 325 mg by mouth daily.   ATORVASTATIN (LIPITOR) 10 MG TABLET    Take  10 mg by mouth daily.   CITALOPRAM (CELEXA) 40 MG TABLET    Take 40 mg by mouth daily.   FUROSEMIDE (LASIX) 20 MG TABLET    Take three tablets twice daily for swelling   GABAPENTIN (NEURONTIN) 100 MG CAPSULE    Take 200-400 mg by mouth at bedtime. Takes 200mg  in the morning and takes 400mg  in the evening   LISINOPRIL (PRINIVIL,ZESTRIL) 10 MG TABLET    Take 10 mg by mouth daily.   METOPROLOL (LOPRESSOR) 50 MG TABLET    Take 50 mg by mouth 2 (two) times daily.    NAPROXEN (NAPROSYN) 500 MG TABLET    Take one tablet  once daily for pain   NITROGLYCERIN (NITROSTAT) 0.6 MG SL TABLET    Place 0.6 mg under the tongue every 5 (five) minutes as needed.   OXYCODONE (OXY IR/ROXICODONE) 5 MG IMMEDIATE RELEASE TABLET    Take 1-2 tablets (5-10 mg total) by mouth every 4 (four) hours as needed.   POTASSIUM PO    Take 10 mEq by mouth 2 (two) times daily. Take one tablet once daily for potassium   SENNOSIDES-DOCUSATE SODIUM (SENOKOT-S) 8.6-50 MG TABLET    Take 2 tablets by mouth daily as needed.    TAMSULOSIN HCL (FLOMAX) 0.4 MG CAPS    Take 0.4 mg by mouth daily.   TRAZODONE (DESYREL) 300 MG TABLET    Take 300 mg by mouth at bedtime.   WARFARIN (COUMADIN) 5 MG TABLET    Take 1 tablet (5 mg total) by mouth one time only at 6 PM.  Modified Medications   No medications on file  Discontinued Medications   BENAZEPRIL (LOTENSIN) 10 MG TABLET    Take 10 mg by mouth daily.   DEXTROSE 5 % SOLN 50 ML WITH CEFTRIAXONE 2 G SOLR 2 G    Inject 2 g into the vein daily.   FENTANYL (DURAGESIC - DOSED MCG/HR) 12 MCG/HR    Place 1 patch onto the skin every 3 (three) days.   FUROSEMIDE (LASIX) 40 MG TABLET    Take 1 tablet (40 mg total) by mouth daily.   PRAVASTATIN (PRAVACHOL) 20 MG TABLET    Take 20 mg by mouth daily.   TERAZOSIN (HYTRIN) 2 MG CAPSULE    Take 2 mg by mouth at bedtime.     Physical Exam:  Filed Vitals:   08/19/12 1322  BP: 128/84  Pulse: 79  Temp: 98 F (36.7 C)  TempSrc: Oral  Resp: 15  Height: 5' 7.25" (1.708 m)  Weight: 313 lb 12.8 oz (142.339 kg)   Physical Exam    Labs reviewed: Basic Metabolic Panel:  Recent Labs  40/98/11 0048 04/22/12 0525  04/29/12 0500 04/30/12 0500 05/01/12 0517  NA 135 134*  < > 136 136 136  K 4.0 3.9  < > 3.9 4.1 3.6  CL 102 98  < > 101 101 98  CO2  --  21  < > 25 28 31   GLUCOSE 310* 334*  < > 201* 200* 149*  BUN 44* 48*  < > 10 8 10   CREATININE 2.80* 3.36*  < > 0.83 0.82 0.88  CALCIUM  --  8.1*  < > 7.9* 8.0* 7.9*  TSH  --  0.395  --   --   --   --   < > =  values in this interval not displayed. Liver Function Tests:  Recent Labs  04/21/12 1610 04/22/12 0525  AST 40* 103*  ALT  14 27  ALKPHOS 150* 124*  BILITOT 0.6 0.5  PROT 7.0 5.9*  ALBUMIN 2.4* 2.0*   No results found for this basename: LIPASE, AMYLASE,  in the last 8760 hours No results found for this basename: AMMONIA,  in the last 8760 hours CBC:  Recent Labs  04/22/12 0525 04/23/12 0500  04/28/12 1050 04/30/12 0500 04/30/12 1533  WBC 17.5* 17.5*  < > 26.7* 17.9* 18.6*  NEUTROABS  --  14.2*  --   --   --   --   HGB 11.7* 12.1*  < > 11.6* 11.4* 10.9*  HCT 34.0* 34.8*  < > 34.2* 34.6* 33.1*  MCV 88.1 88.3  < > 90.2 93.3 92.2  PLT 156 152  < > 231 407* 451*  < > = values in this interval not displayed. Lipid Panel: No results found for this basename: CHOL, HDL, LDLCALC, TRIG, CHOLHDL, LDLDIRECT,  in the last 8760 hours  Past Procedures:     Assessment/Plan Diabetes mellitus Reports a family history and borderline diabetes in the past- will check A1c    Labs/tests ordered

## 2012-08-19 NOTE — Assessment & Plan Note (Addendum)
Reports a family history and borderline diabetes in the past- will check A1c if he has not had this already done in the last few months. Plans to bring lab work with him to next visit

## 2012-08-19 NOTE — Assessment & Plan Note (Signed)
Reports this resolved with stopping coumadin. INR level sub- theraputic at 1.2 will stop coumadin and start xarelto at this time.

## 2012-08-19 NOTE — Assessment & Plan Note (Signed)
Unchanged- awaiting ortho consult through the [place]

## 2012-08-19 NOTE — Assessment & Plan Note (Signed)
Stable- to cont current medications  

## 2012-08-19 NOTE — Assessment & Plan Note (Signed)
Reports he recently got labs through [place] will bring at next visit

## 2012-08-19 NOTE — Patient Instructions (Addendum)
Stop coumadin Start xarelto 20mg  daily Follow up in 1 month for EV May come to clinic sooner if needed if rash does not improve or gets worse with medication change Bring Lab work to next appt

## 2012-08-19 NOTE — Assessment & Plan Note (Signed)
Will stop coumadin and start xarelto at this time

## 2012-08-25 ENCOUNTER — Encounter: Payer: Self-pay | Admitting: *Deleted

## 2012-08-25 ENCOUNTER — Other Ambulatory Visit: Payer: Self-pay | Admitting: *Deleted

## 2012-08-25 MED ORDER — RIVAROXABAN 20 MG PO TABS: 20.0000 mg | ORAL_TABLET | Freq: Every day | ORAL | Status: DC

## 2012-09-04 ENCOUNTER — Encounter: Payer: Self-pay | Admitting: Nurse Practitioner

## 2012-09-04 ENCOUNTER — Ambulatory Visit (INDEPENDENT_AMBULATORY_CARE_PROVIDER_SITE_OTHER): Payer: Medicare Other | Admitting: Nurse Practitioner

## 2012-09-04 VITALS — BP 128/82 | HR 62 | Temp 97.0°F | Resp 14 | Ht 69.0 in | Wt 314.4 lb

## 2012-09-04 DIAGNOSIS — R21 Rash and other nonspecific skin eruption: Secondary | ICD-10-CM

## 2012-09-04 MED ORDER — PREDNISONE 10 MG PO TABS: 10.0000 mg | ORAL_TABLET | Freq: Every day | ORAL | Status: DC

## 2012-09-04 NOTE — Assessment & Plan Note (Signed)
Diffuse- will give prednisone 60 mg for 5 days then decrease by 10 mg until dc. Cont calamine lotion and benadryl however to decrease benadryl to 1-2 tablets every 8 hours as needed. To call and follow up if rash gets worse

## 2012-09-04 NOTE — Progress Notes (Signed)
Patient ID: Anthony Strickland, male   DOB: 15-Jun-1945, 67 y.o.   MRN: [id number]   No Known Allergies  Chief Complaint  Patient presents with  . Rash    back, groin and extremity. Itches    HPI: Patient is a 67 y.o. male seen in the office today for rash Thought to be related to coumadin but the coumadin was stopped and rash has not gotten any better. Pt is taking 75 mg of benadryl  Abdomen, back, groin all involved that has been going on for 2 months. Thought it was getting better but now worse. Have not tired any treatment  Review of Systems:  Review of Systems  Constitutional: Negative for fever, chills and malaise/fatigue.  Respiratory: Negative for cough, shortness of breath and wheezing.   Cardiovascular: Negative for chest pain and palpitations.  Skin: Positive for itching and rash.  Neurological: Negative for weakness.     Past Medical History  Diagnosis Date  . Asthma   . Rheumatoid arthritis   . Hypertension   . Depression   . Hyperlipidemia   . DVT (deep venous thrombosis)   . History of recurrent UTIs   . PTSD (post-traumatic stress disorder)   . BPH (benign prostatic hyperplasia)   . CAD (coronary artery disease)    Past Surgical History  Procedure Laterality Date  . Coronary stent placement    . Knee arthroscopy  04/23/2012    Procedure: ARTHROSCOPY KNEE;  Surgeon: Mable Paris, MD;  Location: [facility] OR;  Service: Orthopedics;  Laterality: Right;  . Cardiac surgery  2009    stent placement   Social History:   reports that he has quit smoking. His smoking use included Cigarettes. He has a 3 pack-year smoking history. He has never used smokeless tobacco. He reports that he does not drink alcohol or use illicit drugs.  Family History  Problem Relation Age of Onset  . Hypertension Mother   . Other Father     Medications: Patient's Medications  New Prescriptions   PREDNISONE (DELTASONE) 10 MG TABLET    Take 1 tablet (10 mg total) by mouth daily.  Take 6 tablets by mouth for 5 days then decrease by 10 mg daily until done  Previous Medications   ASPIRIN 325 MG EC TABLET    Take 325 mg by mouth daily.   ATORVASTATIN (LIPITOR) 10 MG TABLET    Take 10 mg by mouth daily.   CITALOPRAM (CELEXA) 40 MG TABLET    Take 40 mg by mouth daily.   FUROSEMIDE (LASIX) 20 MG TABLET    Take three tablets twice daily for swelling   GABAPENTIN (NEURONTIN) 100 MG CAPSULE    Take 200-400 mg by mouth at bedtime. Takes 200mg  in the morning and takes 400mg  in the evening   LISINOPRIL (PRINIVIL,ZESTRIL) 10 MG TABLET    Take 10 mg by mouth daily.   METOPROLOL (LOPRESSOR) 50 MG TABLET    Take 50 mg by mouth 2 (two) times daily.    NAPROXEN (NAPROSYN) 500 MG TABLET    Take one tablet once daily for pain   NITROGLYCERIN (NITROSTAT) 0.6 MG SL TABLET    Place 0.6 mg under the tongue every 5 (five) minutes as needed.   OXYCODONE (OXY IR/ROXICODONE) 5 MG IMMEDIATE RELEASE TABLET    Take 1-2 tablets (5-10 mg total) by mouth every 4 (four) hours as needed.   POTASSIUM PO    Take 10 mEq by mouth 2 (two) times daily. Take one tablet once  daily for potassium   SENNOSIDES-DOCUSATE SODIUM (SENOKOT-S) 8.6-50 MG TABLET    Take 2 tablets by mouth daily as needed.    TAMSULOSIN HCL (FLOMAX) 0.4 MG CAPS    Take 0.4 mg by mouth daily.   TRAZODONE (DESYREL) 300 MG TABLET    Take 300 mg by mouth at bedtime.  Modified Medications   No medications on file  Discontinued Medications   RIVAROXABAN (XARELTO) 20 MG TABS    Take 1 tablet (20 mg total) by mouth daily.     Physical Exam: Physical Exam  Constitutional: He appears well-developed and well-nourished. No distress.  Cardiovascular: Normal rate, regular rhythm and normal heart sounds.   Pulmonary/Chest: Effort normal and breath sounds normal.  Skin: Skin is warm and dry. Rash noted. He is not diaphoretic.  diffuse rash on trunk back and arms with areas of excoriation     Filed Vitals:   09/04/12 1153  BP: 128/82  Pulse: 62   Temp: 97 F (36.1 C)  TempSrc: Oral  Resp: 14  Height: 5\' 9"  (1.753 m)  Weight: 314 lb 6.4 oz (142.611 kg)       Assessment/Plan Rash Diffuse- will give prednisone 60 mg for 5 days then decrease by 10 mg until dc. Cont calamine lotion and benadryl however to decrease benadryl to 1-2 tablets every 8 hours as needed. To call and follow up if rash gets worse

## 2012-09-04 NOTE — Patient Instructions (Addendum)
Follow up if rash fails to improve or gets worse with treatment Keep follow up appt

## 2012-09-15 ENCOUNTER — Telehealth: Payer: Self-pay | Admitting: *Deleted

## 2012-09-15 NOTE — Telephone Encounter (Signed)
Patient wife called and said her husband's rash, all over, is worse. Its itchy and red and in some areas it looks like hives. While he was on Prednisone it was much better but as soon as he finish the Prednisone 3 days ago it has worsen. Please Advise. [facility]

## 2012-09-16 ENCOUNTER — Encounter: Payer: Self-pay | Admitting: *Deleted

## 2012-09-16 ENCOUNTER — Ambulatory Visit (INDEPENDENT_AMBULATORY_CARE_PROVIDER_SITE_OTHER): Payer: Medicare Other | Admitting: Nurse Practitioner

## 2012-09-16 ENCOUNTER — Encounter: Payer: Self-pay | Admitting: Nurse Practitioner

## 2012-09-16 VITALS — BP 146/84 | HR 75 | Temp 98.0°F | Resp 15 | Ht 69.0 in | Wt 318.2 lb

## 2012-09-16 DIAGNOSIS — I82409 Acute embolism and thrombosis of unspecified deep veins of unspecified lower extremity: Secondary | ICD-10-CM

## 2012-09-16 DIAGNOSIS — E785 Hyperlipidemia, unspecified: Secondary | ICD-10-CM | POA: Diagnosis not present

## 2012-09-16 DIAGNOSIS — I82401 Acute embolism and thrombosis of unspecified deep veins of right lower extremity: Secondary | ICD-10-CM

## 2012-09-16 DIAGNOSIS — R21 Rash and other nonspecific skin eruption: Secondary | ICD-10-CM

## 2012-09-16 DIAGNOSIS — E119 Type 2 diabetes mellitus without complications: Secondary | ICD-10-CM | POA: Diagnosis not present

## 2012-09-16 MED ORDER — WARFARIN SODIUM 5 MG PO TABS: 5.0000 mg | ORAL_TABLET | Freq: Every day | ORAL | Status: DC

## 2012-09-16 MED ORDER — PREDNISONE 10 MG PO TABS: 10.0000 mg | ORAL_TABLET | Freq: Every day | ORAL | Status: DC

## 2012-09-16 MED ORDER — PERMETHRIN 5 % EX CREA: TOPICAL_CREAM | Freq: Once | CUTANEOUS | Status: DC

## 2012-09-16 NOTE — Patient Instructions (Addendum)
Scabies Scabies are small bugs (mites) that burrow under the skin and cause red bumps and severe itching. These bugs can only be seen with a microscope. Scabies are highly contagious. They can spread easily from person to person by direct contact. They are also spread through sharing clothing or linens that have the scabies mites living in them. It is not unusual for an entire family to become infected through shared towels, clothing, or bedding.  HOME CARE INSTRUCTIONS   Your caregiver may prescribe a cream or lotion to kill the mites. If cream is prescribed, massage the cream into the entire body from the neck to the bottom of both feet. Also massage the cream into the scalp and face if your child is less than 1 year old. Avoid the eyes and mouth. Do not wash your hands after application.  Leave the cream on for 8 to 12 hours. Your child should bathe or shower after the 8 to 12 hour application period. Sometimes it is helpful to apply the cream to your child right before bedtime.  One treatment is usually effective and will eliminate approximately 95% of infestations. For severe cases, your caregiver may decide to repeat the treatment in 1 week. Everyone in your household should be treated with one application of the cream.  New rashes or burrows should not appear within 24 to 48 hours after successful treatment. However, the itching and rash may last for 2 to 4 weeks after successful treatment. Your caregiver may prescribe a medicine to help with the itching or to help the rash go away more quickly.  Scabies can live on clothing or linens for up to 3 days. All of your child's recently used clothing, towels, stuffed toys, and bed linens should be washed in hot water and then dried in a dryer for at least 20 minutes on high heat. Items that cannot be washed should be enclosed in a plastic bag for at least 3 days.  To help relieve itching, bathe your child in a cool bath or apply cool washcloths to the  affected areas.  Your child may return to school after treatment with the prescribed cream. SEEK MEDICAL CARE IF:   The itching persists longer than 4 weeks after treatment.  The rash spreads or becomes infected. Signs of infection include red blisters or yellow-tan crust. Document Released: 04/23/2005 Document Revised: 07/16/2011 Document Reviewed: 09/01/2008 [employer] Patient Information 2013 [employer].  

## 2012-09-16 NOTE — Progress Notes (Signed)
Patient ID: Anthony Strickland, male   DOB: Jun 07, 1945, 67 y.o.   MRN: [id number]   No Known Allergies  Chief Complaint  Patient presents with  . Rash    all over  . Right leg red and warm to touch    HPI: Patient is a 67 y.o. male seen in the office today for rash Took prednisone course- rash and itching got better now worse since he has been off-  Wife noticed hive like lesions behind knee- this has resolved now  Pt reports he thinks this is coming from sheets or medications   DVT in right leg- stopped coumadin and was supposed to start Xarleto Has not started Xarelto and now leg is red and swollen   Review of Systems:  Review of Systems  Constitutional: Negative for fever and chills.  Respiratory: Negative for shortness of breath.   Cardiovascular: Negative for chest pain and palpitations.  Genitourinary: Negative for dysuria and urgency. Flank pain: right knee.  Musculoskeletal: Positive for joint pain. Negative for myalgias and back pain.  Neurological: Negative for weakness.     Past Medical History  Diagnosis Date  . Asthma   . Rheumatoid arthritis   . Hypertension   . Depression   . Hyperlipidemia   . DVT (deep venous thrombosis)   . History of recurrent UTIs   . PTSD (post-traumatic stress disorder)   . BPH (benign prostatic hyperplasia)   . CAD (coronary artery disease)    Past Surgical History  Procedure Laterality Date  . Coronary stent placement    . Knee arthroscopy  04/23/2012    Procedure: ARTHROSCOPY KNEE;  Surgeon: Mable Paris, MD;  Location: [facility] OR;  Service: Orthopedics;  Laterality: Right;  . Cardiac surgery  2009    stent placement   Social History:   reports that he has quit smoking. His smoking use included Cigarettes. He has a 3 pack-year smoking history. He has never used smokeless tobacco. He reports that he does not drink alcohol or use illicit drugs.  Family History  Problem Relation Age of Onset  . Hypertension Mother   .  Other Father     Medications: Patient's Medications  New Prescriptions   PERMETHRIN (ACTICIN) 5 % CREAM    Apply topically once.   WARFARIN (COUMADIN) 5 MG TABLET    Take 1 tablet (5 mg total) by mouth daily. Take 1 and a half tablets (7.5 mg) daily  Previous Medications   ASPIRIN 325 MG EC TABLET    Take 325 mg by mouth daily.   ATORVASTATIN (LIPITOR) 10 MG TABLET    Take 10 mg by mouth daily.   CITALOPRAM (CELEXA) 40 MG TABLET    Take 40 mg by mouth daily.   FUROSEMIDE (LASIX) 20 MG TABLET    Take three tablets twice daily for swelling   GABAPENTIN (NEURONTIN) 100 MG CAPSULE    Take 200-400 mg by mouth at bedtime. Takes 200mg  in the morning and takes 400mg  in the evening   LISINOPRIL (PRINIVIL,ZESTRIL) 10 MG TABLET    Take 10 mg by mouth daily.   METOPROLOL (LOPRESSOR) 50 MG TABLET    Take 50 mg by mouth 2 (two) times daily.    NAPROXEN (NAPROSYN) 500 MG TABLET    Take one tablet once daily for pain   NITROGLYCERIN (NITROSTAT) 0.6 MG SL TABLET    Place 0.6 mg under the tongue every 5 (five) minutes as needed.   OXYCODONE (OXY IR/ROXICODONE) 5 MG IMMEDIATE RELEASE TABLET  Take 1-2 tablets (5-10 mg total) by mouth every 4 (four) hours as needed.   POTASSIUM PO    Take 10 mEq by mouth 2 (two) times daily. Take one tablet once daily for potassium   SENNOSIDES-DOCUSATE SODIUM (SENOKOT-S) 8.6-50 MG TABLET    Take 2 tablets by mouth daily as needed.    TAMSULOSIN HCL (FLOMAX) 0.4 MG CAPS    Take 0.4 mg by mouth daily.   TRAZODONE (DESYREL) 300 MG TABLET    Take 300 mg by mouth at bedtime.  Modified Medications   Modified Medication Previous Medication   PREDNISONE (DELTASONE) 10 MG TABLET predniSONE (DELTASONE) 10 MG tablet      Take 1 tablet (10 mg total) by mouth daily. Take 6 tablets by mouth for 7 days then decrease by 10 mg daily until done    Take 1 tablet (10 mg total) by mouth daily. Take 6 tablets by mouth for 5 days then decrease by 10 mg daily until done  Discontinued Medications    No medications on file     Physical Exam:  Filed Vitals:   09/16/12 1408  BP: 146/84  Pulse: 75  Temp: 98 F (36.7 C)  TempSrc: Oral  Resp: 15  Height: 5\' 9"  (1.753 m)  Weight: 318 lb 3.2 oz (144.335 kg)    Physical Exam  Constitutional: He appears well-developed and well-nourished. No distress.  Cardiovascular: Normal rate, regular rhythm and normal heart sounds.   Pulmonary/Chest: Effort normal and breath sounds normal.  Musculoskeletal: He exhibits no edema and no tenderness.  Swelling and tenderness to right lower extremities hx of DVT currently not on anticoagulation   Skin: Skin is warm and dry. He is not diaphoretic.  Diffuse atypical scabies rash with excoriation       Assessment/Plan Rash Prednisone helped but did not resolve rash/itching. After consulting Dr Chilton Si it was learned that there was a scabies breakout around the time Mr Clinton Gallant was there for rehab. Will treat with permethrin cream to whole body for 8-12 hours and to clean house extensively; wife aware of scabies treatment   DVT (deep venous thrombosis) Will restart coumadin 7.5 mg and to RTC on Friday for PT/INR check

## 2012-09-17 NOTE — Assessment & Plan Note (Signed)
Prednisone helped but did not resolve rash/itching. After consulting Dr Chilton Si it was learned that there was a scabies breakout around the time Mr Anthony Strickland was there for rehab. Will treat with permethrin cream to whole body for 8-12 hours and to clean house extensively; wife aware of scabies treatment

## 2012-09-17 NOTE — Assessment & Plan Note (Signed)
Will restart coumadin 7.5 mg and to RTC on Friday for PT/INR check

## 2012-09-19 ENCOUNTER — Ambulatory Visit: Payer: Medicare Other

## 2012-09-22 ENCOUNTER — Ambulatory Visit (INDEPENDENT_AMBULATORY_CARE_PROVIDER_SITE_OTHER): Payer: Medicare Other | Admitting: Nurse Practitioner

## 2012-09-22 ENCOUNTER — Encounter: Payer: Self-pay | Admitting: Geriatric Medicine

## 2012-09-22 ENCOUNTER — Encounter: Payer: Self-pay | Admitting: Nurse Practitioner

## 2012-09-22 VITALS — BP 146/88 | HR 54 | Temp 98.0°F | Resp 14 | Wt 315.4 lb

## 2012-09-22 DIAGNOSIS — E119 Type 2 diabetes mellitus without complications: Secondary | ICD-10-CM | POA: Diagnosis not present

## 2012-09-22 DIAGNOSIS — I82401 Acute embolism and thrombosis of unspecified deep veins of right lower extremity: Secondary | ICD-10-CM

## 2012-09-22 DIAGNOSIS — R300 Dysuria: Secondary | ICD-10-CM

## 2012-09-22 DIAGNOSIS — I82409 Acute embolism and thrombosis of unspecified deep veins of unspecified lower extremity: Secondary | ICD-10-CM | POA: Diagnosis not present

## 2012-09-22 DIAGNOSIS — R21 Rash and other nonspecific skin eruption: Secondary | ICD-10-CM | POA: Diagnosis not present

## 2012-09-22 DIAGNOSIS — I10 Essential (primary) hypertension: Secondary | ICD-10-CM

## 2012-09-22 DIAGNOSIS — E785 Hyperlipidemia, unspecified: Secondary | ICD-10-CM | POA: Diagnosis not present

## 2012-09-22 NOTE — Progress Notes (Signed)
Patient ID: Anthony Strickland, male   DOB: 11-Jun-1945, 67 y.o.   MRN: [id number]   No Known Allergies  Chief Complaint  Patient presents with  . Medical Managment of Chronic Issues    HPI: Patient is a 67 y.o. male seen in the office today for follow up rash, right leg, and blood work Hospital doctor to [employer] on Friday and got PT/INR checked it was low and he was approved for xarelto. Currently on xarelto without any noted side effects.  Treated for scabies with the cream, wife treated herself and cleaned the house Currently rash is gone and is not itching- still taking prednisone   Review of Systems:  Review of Systems  Constitutional: Negative for fever, chills and malaise/fatigue.  Eyes: Negative for blurred vision.  Respiratory: Positive for wheezing. Negative for cough and shortness of breath (at times using albuterol- only need this once a month).   Cardiovascular: Positive for leg swelling. Negative for chest pain and palpitations.  Gastrointestinal: Negative for heartburn, abdominal pain, diarrhea and constipation.  Genitourinary: Positive for dysuria. Negative for urgency, frequency and hematuria.  Musculoskeletal: Positive for joint pain (knees). Negative for myalgias.       Redness with a blister in right lower leg- wife has been treating with ointment and bandaids- getting better Swelling is back to baseline  Skin: Positive for rash (residuals from scabies ). Negative for itching.  Neurological: Positive for tingling (numbness ). Negative for headaches.  Endo/Heme/Allergies: Positive for environmental allergies (takes claritin 10 mg daily).  Psychiatric/Behavioral: Negative for depression. The patient is not nervous/anxious and does not have insomnia.     Past Medical History  Diagnosis Date  . Asthma   . Rheumatoid arthritis   . Hypertension   . Depression   . Hyperlipidemia   . DVT (deep venous thrombosis)   . History of recurrent UTIs   . PTSD (post-traumatic stress disorder)    . BPH (benign prostatic hyperplasia)   . CAD (coronary artery disease)    Past Surgical History  Procedure Laterality Date  . Coronary stent placement    . Knee arthroscopy  04/23/2012    Procedure: ARTHROSCOPY KNEE;  Surgeon: Mable Paris, MD;  Location: [facility] OR;  Service: Orthopedics;  Laterality: Right;  . Cardiac surgery  2009    stent placement   Social History:   reports that he has quit smoking. His smoking use included Cigarettes. He has a 3 pack-year smoking history. He has never used smokeless tobacco. He reports that he does not drink alcohol or use illicit drugs.  Family History  Problem Relation Age of Onset  . Hypertension Mother   . Other Father     Medications: Patient's Medications  New Prescriptions   No medications on file  Previous Medications   ALBUTEROL (PROVENTIL HFA;VENTOLIN HFA) 108 (90 BASE) MCG/ACT INHALER    Inhale two puffs four times daily as needed for asthma   BENAZEPRIL (LOTENSIN) 40 MG TABLET    Take 40 mg by mouth daily. For bp   CARBOXYMETHYLCELLULOSE (REFRESH PLUS) 0.5 % SOLN    One drop both eyes four times daily   CHOLECALCIFEROL (VITAMIN D) 2000 UNITS CAPS    Take one tablet once daily   CITALOPRAM (CELEXA) 40 MG TABLET    Take 40 mg by mouth daily.   FUROSEMIDE (LASIX) 20 MG TABLET    Take three tablets twice daily for swelling   GABAPENTIN (NEURONTIN) 100 MG CAPSULE    Take 200-400 mg by  mouth at bedtime. Takes 200mg  in the morning and takes 400mg  in the evening   LORATADINE (CLARITIN) 10 MG TABLET    Take 10 mg by mouth daily.   METOPROLOL (LOPRESSOR) 50 MG TABLET    Take 50 mg by mouth 2 (two) times daily.    NITROGLYCERIN (NITROSTAT) 0.6 MG SL TABLET    Place 0.6 mg under the tongue every 5 (five) minutes as needed.   POTASSIUM PO    Take 10 mEq by mouth 2 (two) times daily. Take one tablet once daily for potassium   PRAVASTATIN (PRAVACHOL) 20 MG TABLET    Take 20 mg by mouth daily.   PREDNISONE (DELTASONE) 10 MG TABLET     Take 1 tablet (10 mg total) by mouth daily. Take 6 tablets by mouth for 7 days then decrease by 10 mg daily until done   RIVAROXABAN (XARELTO) 20 MG TABS    Take by mouth daily.   SENNOSIDES-DOCUSATE SODIUM (SENOKOT-S) 8.6-50 MG TABLET    Take 2 tablets by mouth daily as needed.    TRAZODONE (DESYREL) 300 MG TABLET    Take 300 mg by mouth at bedtime.   VARDENAFIL (LEVITRA) 20 MG TABLET    Take 20 mg by mouth daily as needed for erectile dysfunction.  Modified Medications   No medications on file  Discontinued Medications   ASPIRIN 325 MG EC TABLET    Take 325 mg by mouth daily.   ATORVASTATIN (LIPITOR) 10 MG TABLET    Take 10 mg by mouth daily.   LISINOPRIL (PRINIVIL,ZESTRIL) 10 MG TABLET    Take 10 mg by mouth daily.   NAPROXEN (NAPROSYN) 500 MG TABLET    Take one tablet once daily for pain   OXYCODONE (OXY IR/ROXICODONE) 5 MG IMMEDIATE RELEASE TABLET    Take 1-2 tablets (5-10 mg total) by mouth every 4 (four) hours as needed.   PERMETHRIN (ACTICIN) 5 % CREAM    Apply topically once.   TAMSULOSIN HCL (FLOMAX) 0.4 MG CAPS    Take 0.4 mg by mouth daily.   WARFARIN (COUMADIN) 5 MG TABLET    Take 1 tablet (5 mg total) by mouth daily. Take 1 and a half tablets (7.5 mg) daily     Physical Exam:  Filed Vitals:   09/22/12 1059  BP: 146/88  Pulse: 54  Temp: 98 F (36.7 C)  TempSrc: Oral  Resp: 14  Weight: 315 lb 6.4 oz (143.065 kg)   Physical Exam  Constitutional: He is oriented to person, place, and time. He appears well-developed and well-nourished. No distress.  HENT:  Head: Normocephalic.  Eyes: EOM are normal. Pupils are equal, round, and reactive to light.  Cardiovascular: Normal rate, regular rhythm and normal heart sounds.   Pulmonary/Chest: Effort normal and breath sounds normal.  Abdominal: Soft. Bowel sounds are normal.  Musculoskeletal: He exhibits edema (+1 edema).  Neurological: He is alert and oriented to person, place, and time.  Skin: Skin is warm and dry. He is not  diaphoretic.  1 cm X 1 cm open sore on right shin with serous drainage,  no warmth, tenderness or signs of infection  Psychiatric: He has a normal mood and affect.     Labs reviewed: Basic Metabolic Panel:  Recent Labs  95/62/13 0048 04/22/12 0525  04/29/12 0500 04/30/12 0500 05/01/12 0517  NA 135 134*  < > 136 136 136  K 4.0 3.9  < > 3.9 4.1 3.6  CL 102 98  < > 101  101 98  CO2  --  21  < > 25 28 31   GLUCOSE 310* 334*  < > 201* 200* 149*  BUN 44* 48*  < > 10 8 10   CREATININE 2.80* 3.36*  < > 0.83 0.82 0.88  CALCIUM  --  8.1*  < > 7.9* 8.0* 7.9*  TSH  --  0.395  --   --   --   --   < > = values in this interval not displayed. Liver Function Tests:  Recent Labs  04/21/12 1610 04/22/12 0525  AST 40* 103*  ALT 14 27  ALKPHOS 150* 124*  BILITOT 0.6 0.5  PROT 7.0 5.9*  ALBUMIN 2.4* 2.0*   No results found for this basename: LIPASE, AMYLASE,  in the last 8760 hours No results found for this basename: AMMONIA,  in the last 8760 hours CBC:  Recent Labs  04/22/12 0525 04/23/12 0500  04/28/12 1050 04/30/12 0500 04/30/12 1533  WBC 17.5* 17.5*  < > 26.7* 17.9* 18.6*  NEUTROABS  --  14.2*  --   --   --   --   HGB 11.7* 12.1*  < > 11.6* 11.4* 10.9*  HCT 34.0* 34.8*  < > 34.2* 34.6* 33.1*  MCV 88.1 88.3  < > 90.2 93.3 92.2  PLT 156 152  < > 231 407* 451*  < > = values in this interval not displayed. Lipid Panel: No results found for this basename: CHOL, HDL, LDLCALC, TRIG, CHOLHDL, LDLDIRECT,  in the last 8760 hours  Past Procedures:     Assessment/Plan Dysuria History of UTIs- will get UA c&s- encouraged hydration  Essential hypertension, benign pts hypertension is stable will cont current medications  Diabetes mellitus Lifestyle modifications encouraged- will get AiC and urine micro  DVT (deep venous thrombosis) Stable- now currently on xarelto   Rash Resolved after scabies treatment   Other and unspecified hyperlipidemia Lifestyle modifications  encouraged- will check fasting lipids      Labs/tests ordered Ua, C&S, urine micro, cmp, lipids, cbc

## 2012-09-22 NOTE — Assessment & Plan Note (Signed)
History of UTIs- will get UA c&s- encouraged hydration

## 2012-09-22 NOTE — Assessment & Plan Note (Signed)
pts hypertension is stable will cont current medications

## 2012-09-22 NOTE — Assessment & Plan Note (Signed)
Lifestyle modifications encouraged- will get AiC and urine micro

## 2012-09-22 NOTE — Patient Instructions (Addendum)
Physical in 3 months with Dr Glade Lloyd  6 months for a RV with Candelaria Celeste, NP

## 2012-09-22 NOTE — Assessment & Plan Note (Signed)
Stable- now currently on xarelto

## 2012-09-22 NOTE — Assessment & Plan Note (Signed)
Resolved after scabies treatment

## 2012-09-22 NOTE — Assessment & Plan Note (Signed)
Lifestyle modifications encouraged- will check fasting lipids

## 2012-09-23 LAB — CBC WITH DIFFERENTIAL/PLATELET
Basophils Absolute: 0 10*3/uL (ref 0.0–0.2)
Eosinophils Absolute: 0.1 10*3/uL (ref 0.0–0.4)
Immature Grans (Abs): 0 10*3/uL (ref 0.0–0.1)
Lymphs: 17 % (ref 14–46)
MCH: 29.7 pg (ref 26.6–33.0)
MCHC: 33.3 g/dL (ref 31.5–35.7)
Monocytes: 5 % (ref 4–12)
Neutrophils Relative %: 77 % — ABNORMAL HIGH (ref 40–74)
RBC: 5.01 x10E6/uL (ref 4.14–5.80)

## 2012-09-23 LAB — URINALYSIS
Nitrite, UA: NEGATIVE
Specific Gravity, UA: 1.016 (ref 1.005–1.030)
Urobilinogen, Ur: 0.2 mg/dL (ref 0.0–1.9)
pH, UA: 6.5 (ref 5.0–7.5)

## 2012-09-23 LAB — LIPID PANEL
Chol/HDL Ratio: 2.4 ratio units (ref 0.0–5.0)
Cholesterol, Total: 172 mg/dL (ref 100–199)
LDL Calculated: 83 mg/dL (ref 0–99)
Triglycerides: 78 mg/dL (ref 0–149)
VLDL Cholesterol Cal: 16 mg/dL (ref 5–40)

## 2012-09-23 LAB — COMPREHENSIVE METABOLIC PANEL
AST: 9 IU/L (ref 0–40)
CO2: 26 mmol/L (ref 19–28)
Calcium: 9.2 mg/dL (ref 8.6–10.2)
Chloride: 97 mmol/L (ref 97–108)
Glucose: 108 mg/dL — ABNORMAL HIGH (ref 65–99)
Potassium: 4.1 mmol/L (ref 3.5–5.2)
Total Protein: 6.4 g/dL (ref 6.0–8.5)

## 2012-09-23 LAB — MICROALBUMIN, URINE: Microalbumin, Urine: 7.1 ug/mL (ref 0.0–17.0)

## 2012-09-25 ENCOUNTER — Telehealth: Payer: Self-pay | Admitting: *Deleted

## 2012-09-25 NOTE — Telephone Encounter (Signed)
Patient wife called and stated that her husband leg/bilster is red and warm to touch. I told her to hand up with me and take her husband to the ED. Mrs Betzold asked me if I was sure? I told her yes and explained the complication that could happen if her husband had a DVT. She verbally understood, and said okay

## 2012-09-26 ENCOUNTER — Emergency Department (HOSPITAL_COMMUNITY)
Admission: EM | Admit: 2012-09-26 | Discharge: 2012-09-26 | Disposition: A | Discharge status: Home / Self Care | Payer: Medicare Other | Attending: Emergency Medicine | Admitting: Emergency Medicine

## 2012-09-26 ENCOUNTER — Other Ambulatory Visit: Payer: Self-pay | Admitting: *Deleted

## 2012-09-26 ENCOUNTER — Encounter (HOSPITAL_COMMUNITY): Payer: Self-pay | Admitting: Cardiology

## 2012-09-26 DIAGNOSIS — Z87891 Personal history of nicotine dependence: Secondary | ICD-10-CM | POA: Insufficient documentation

## 2012-09-26 DIAGNOSIS — Z87440 Personal history of urinary (tract) infections: Secondary | ICD-10-CM | POA: Insufficient documentation

## 2012-09-26 DIAGNOSIS — Z7982 Long term (current) use of aspirin: Secondary | ICD-10-CM | POA: Insufficient documentation

## 2012-09-26 DIAGNOSIS — M7989 Other specified soft tissue disorders: Secondary | ICD-10-CM | POA: Diagnosis not present

## 2012-09-26 DIAGNOSIS — I10 Essential (primary) hypertension: Secondary | ICD-10-CM | POA: Insufficient documentation

## 2012-09-26 DIAGNOSIS — L03119 Cellulitis of unspecified part of limb: Secondary | ICD-10-CM | POA: Insufficient documentation

## 2012-09-26 DIAGNOSIS — Z79899 Other long term (current) drug therapy: Secondary | ICD-10-CM | POA: Insufficient documentation

## 2012-09-26 DIAGNOSIS — I2510 Atherosclerotic heart disease of native coronary artery without angina pectoris: Secondary | ICD-10-CM | POA: Insufficient documentation

## 2012-09-26 DIAGNOSIS — Z8739 Personal history of other diseases of the musculoskeletal system and connective tissue: Secondary | ICD-10-CM | POA: Insufficient documentation

## 2012-09-26 DIAGNOSIS — Z86718 Personal history of other venous thrombosis and embolism: Secondary | ICD-10-CM | POA: Insufficient documentation

## 2012-09-26 DIAGNOSIS — L02419 Cutaneous abscess of limb, unspecified: Secondary | ICD-10-CM | POA: Diagnosis not present

## 2012-09-26 DIAGNOSIS — F3289 Other specified depressive episodes: Secondary | ICD-10-CM | POA: Insufficient documentation

## 2012-09-26 DIAGNOSIS — L539 Erythematous condition, unspecified: Secondary | ICD-10-CM | POA: Diagnosis not present

## 2012-09-26 DIAGNOSIS — Z87448 Personal history of other diseases of urinary system: Secondary | ICD-10-CM | POA: Diagnosis not present

## 2012-09-26 DIAGNOSIS — J45909 Unspecified asthma, uncomplicated: Secondary | ICD-10-CM | POA: Diagnosis not present

## 2012-09-26 DIAGNOSIS — E785 Hyperlipidemia, unspecified: Secondary | ICD-10-CM | POA: Diagnosis not present

## 2012-09-26 DIAGNOSIS — F329 Major depressive disorder, single episode, unspecified: Secondary | ICD-10-CM | POA: Insufficient documentation

## 2012-09-26 DIAGNOSIS — Z8659 Personal history of other mental and behavioral disorders: Secondary | ICD-10-CM | POA: Diagnosis not present

## 2012-09-26 DIAGNOSIS — L0390 Cellulitis, unspecified: Secondary | ICD-10-CM

## 2012-09-26 LAB — CBC WITH DIFFERENTIAL/PLATELET
Basophils Relative: 0 % (ref 0–1)
Hemoglobin: 15.4 g/dL (ref 13.0–17.0)
Lymphocytes Relative: 18 % (ref 12–46)
Lymphs Abs: 2.4 10*3/uL (ref 0.7–4.0)
MCHC: 34.7 g/dL (ref 30.0–36.0)
Monocytes Relative: 8 % (ref 3–12)
Neutro Abs: 9.1 10*3/uL — ABNORMAL HIGH (ref 1.7–7.7)
Neutrophils Relative %: 71 % (ref 43–77)
RBC: 5 MIL/uL (ref 4.22–5.81)
WBC: 12.9 10*3/uL — ABNORMAL HIGH (ref 4.0–10.5)

## 2012-09-26 LAB — COMPREHENSIVE METABOLIC PANEL
AST: 10 U/L (ref 0–37)
Albumin: 3.3 g/dL — ABNORMAL LOW (ref 3.5–5.2)
Alkaline Phosphatase: 69 U/L (ref 39–117)
BUN: 15 mg/dL (ref 6–23)
Chloride: 99 mEq/L (ref 96–112)
Potassium: 3.9 mEq/L (ref 3.5–5.1)
Total Bilirubin: 0.3 mg/dL (ref 0.3–1.2)

## 2012-09-26 LAB — POCT I-STAT, CHEM 8
BUN: 18 mg/dL (ref 6–23)
Calcium, Ion: 1.12 mmol/L — ABNORMAL LOW (ref 1.13–1.30)
Chloride: 101 mEq/L (ref 96–112)
Creatinine, Ser: 1.1 mg/dL (ref 0.50–1.35)
TCO2: 30 mmol/L (ref 0–100)

## 2012-09-26 LAB — CBC
HCT: 43.1 % (ref 39.0–52.0)
Hemoglobin: 14.9 g/dL (ref 13.0–17.0)
MCH: 30.6 pg (ref 26.0–34.0)
MCV: 88.5 fL (ref 78.0–100.0)
RBC: 4.87 MIL/uL (ref 4.22–5.81)
WBC: 11.3 10*3/uL — ABNORMAL HIGH (ref 4.0–10.5)

## 2012-09-26 MED ORDER — AMOXICILLIN-POT CLAVULANATE 875-125 MG PO TABS: ORAL_TABLET | ORAL | Status: DC

## 2012-09-26 MED ORDER — SULFAMETHOXAZOLE-TMP DS 800-160 MG PO TABS
1.0000 | ORAL_TABLET | Freq: Once | ORAL | Status: AC
  Administered 2012-09-26: 1 via ORAL
  Filled 2012-09-26: qty 1

## 2012-09-26 MED ORDER — SULFAMETHOXAZOLE-TMP DS 800-160 MG PO TABS: 1.0000 | ORAL_TABLET | Freq: Two times a day (BID) | ORAL | Status: DC

## 2012-09-26 MED ORDER — DEXTROSE 5 % IV SOLN
1.0000 g | Freq: Once | INTRAVENOUS | Status: AC
  Administered 2012-09-26: 1 g via INTRAVENOUS
  Filled 2012-09-26: qty 10

## 2012-09-26 NOTE — ED Notes (Signed)
Pt reports that he noticed a blister on his right leg that developed a couple of days ago. Reports that the blister burst and he is having pain there. States that he has hs of blood clot in that leg and has noticed increased redness and swelling. Pt denies any other pain or SOB. Skin warm and dry. Pt reports that he was recently in the hospital for infection and received antibiotics.

## 2012-09-26 NOTE — ED Notes (Signed)
Pedal  Pulse ++.

## 2012-09-26 NOTE — ED Notes (Signed)
Pt discharged.Vital signs stable and GCS 15 

## 2012-09-26 NOTE — ED Provider Notes (Signed)
History     CSN: [id number]  Arrival date & time 09/26/12  1727   First MD Initiated Contact with Patient 09/26/12 2037      Chief Complaint  Patient presents with  . Leg Pain    (Consider location/radiation/quality/duration/timing/severity/associated sxs/prior treatment) HPI Comments: TCC 67-year-old, morbidly obese gentleman, with a history of stent hospitalized for 3, several months ago, presents now with a two-day history of a blister on his anterior shin of his right lower leg.  That ruptured spontaneously, 2, days, ago.  It's been oozing clear fluid.  His physician placed him on Augmentin for UTI and told him it would cover the infection in his leg.  His family became concerned because of increasing redness of his leg.  His right leg is always slightly larger than his left to 2 a chronic DVT, for which she takes Xarelto  Patient is a 67 y.o. male presenting with leg pain. The history is provided by the patient.  Leg Pain Location:  Leg Time since incident:  2 days Leg location:  R leg Pain details:    Quality:  Unable to specify   Radiates to:  Does not radiate   Severity:  No pain   Onset quality:  Gradual   Duration:  2 days   Timing:  Constant   Progression:  Worsening Chronicity:  New Foreign body present:  No foreign bodies Tetanus status:  Unknown Prior injury to area:  No Associated symptoms: no fever     Past Medical History  Diagnosis Date  . Asthma   . Rheumatoid arthritis   . Hypertension   . Depression   . Hyperlipidemia   . DVT (deep venous thrombosis)   . History of recurrent UTIs   . PTSD (post-traumatic stress disorder)   . BPH (benign prostatic hyperplasia)   . CAD (coronary artery disease)     Past Surgical History  Procedure Laterality Date  . Coronary stent placement    . Knee arthroscopy  04/23/2012    Procedure: ARTHROSCOPY KNEE;  Surgeon: Mable Paris, MD;  Location: [facility] OR;  Service: Orthopedics;  Laterality: Right;  .  Cardiac surgery  2009    stent placement    Family History  Problem Relation Age of Onset  . Hypertension Mother   . Other Father     History  Substance Use Topics  . Smoking status: Former Smoker -- 0.25 packs/day for 12 years    Types: Cigarettes  . Smokeless tobacco: Never Used  . Alcohol Use: No      Review of Systems  Constitutional: Negative for fever and chills.  Respiratory: Negative for cough.   Cardiovascular: Positive for leg swelling. Negative for chest pain.  Musculoskeletal: Negative for myalgias and joint swelling.  Skin: Positive for wound.  Neurological: Negative for dizziness, weakness and headaches.  All other systems reviewed and are negative.    Allergies  Review of patient's allergies indicates no known allergies.  Home Medications   Current Outpatient Rx  Name  Route  Sig  Dispense  Refill  . albuterol (PROVENTIL HFA;VENTOLIN HFA) 108 (90 BASE) MCG/ACT inhaler      Inhale two puffs four times daily as needed for asthma         . amoxicillin-clavulanate (AUGMENTIN) 875-125 MG per tablet   Oral   Take 1 tablet by mouth 2 (two) times daily. Take one tablet twice a day for bacteria infection.         Marland Kitchen aspirin 81  MG chewable tablet   Oral   Chew 81 mg by mouth daily.         . benazepril (LOTENSIN) 40 MG tablet   Oral   Take 40 mg by mouth daily. For bp         . carboxymethylcellulose (REFRESH PLUS) 0.5 % SOLN      One drop both eyes four times daily         . Cholecalciferol (VITAMIN D) 2000 UNITS CAPS      Take one tablet once daily         . citalopram (CELEXA) 40 MG tablet   Oral   Take 40 mg by mouth daily.         . furosemide (LASIX) 20 MG tablet      Take three tablets twice daily for swelling         . gabapentin (NEURONTIN) 100 MG capsule   Oral   Take 200-400 mg by mouth at bedtime. Takes 200mg  in the morning and takes 400mg  in the evening         . loratadine (CLARITIN) 10 MG tablet   Oral    Take 10 mg by mouth daily.         . metoprolol (LOPRESSOR) 50 MG tablet   Oral   Take 50 mg by mouth 2 (two) times daily.          . potassium chloride (MICRO-K) 10 MEQ CR capsule   Oral   Take 10 mEq by mouth 2 (two) times daily.         . pravastatin (PRAVACHOL) 20 MG tablet   Oral   Take 20 mg by mouth daily.         . predniSONE (DELTASONE) 10 MG tablet   Oral   Take 1 tablet (10 mg total) by mouth daily. Take 6 tablets by mouth for 7 days then decrease by 10 mg daily until done   60 tablet   0   . Rivaroxaban (XARELTO) 20 MG TABS   Oral   Take 20 mg by mouth daily with supper.          . trazodone (DESYREL) 300 MG tablet   Oral   Take 300 mg by mouth at bedtime.         . vardenafil (LEVITRA) 20 MG tablet   Oral   Take 20 mg by mouth daily as needed for erectile dysfunction.         . nitroGLYCERIN (NITROSTAT) 0.6 MG SL tablet   Sublingual   Place 0.6 mg under the tongue every 5 (five) minutes as needed.         . sulfamethoxazole-trimethoprim (BACTRIM DS) 800-160 MG per tablet   Oral   Take 1 tablet by mouth 2 (two) times daily.   13 tablet   0     BP 152/88  Pulse 75  Temp(Src) 98.2 F (36.8 C) (Oral)  Resp 16  SpO2 97%  Physical Exam  Vitals reviewed. Constitutional: He is oriented to person, place, and time. He appears well-developed and well-nourished.  Morbidly obese  HENT:  Head: Normocephalic.  Eyes: Pupils are equal, round, and reactive to light.  Neck: Normal range of motion.  Cardiovascular: Normal rate and regular rhythm.   Pulmonary/Chest: Effort normal and breath sounds normal.  Musculoskeletal: Normal range of motion. He exhibits tenderness. He exhibits no edema.       Right lower leg: He exhibits swelling. He exhibits no tenderness,  no bony tenderness, no edema and no deformity.       Legs: Area of erythema circumferentially  Lymphadenopathy:       Right: No inguinal adenopathy present.  Neurological: He is alert  and oriented to person, place, and time.  Skin: Skin is warm. There is erythema.  Small 1/2 cm round area on the anterior right shin draining clear fluid    ED Course  Procedures (including critical care time)  Labs Reviewed  CBC WITH DIFFERENTIAL - Abnormal; Notable for the following:    WBC 12.9 (*)    Neutro Abs 9.1 (*)    All other components within normal limits  COMPREHENSIVE METABOLIC PANEL - Abnormal; Notable for the following:    Glucose, Bld 113 (*)    Albumin 3.3 (*)    GFR calc non Af Amer 87 (*)    All other components within normal limits  CBC - Abnormal; Notable for the following:    WBC 11.3 (*)    All other components within normal limits  POCT I-STAT, CHEM 8 - Abnormal; Notable for the following:    Glucose, Bld 179 (*)    Calcium, Ion 1.12 (*)    All other components within normal limits   No results found.   1. Cellulitis       MDM   After patient received 1 g of erythema in his lower has decreased.  He has a strong Doppler pulse in his right foot We'll add Bactrim to the Augmentin to cover atypical bacteria.  Patient was informed to return anytime he becomes worse, including more erythema with streaking up his leg.  Painful lymph nodes in his groin.  Generally feeling febrile or develops any myalgias       Arman Filter, NP 09/26/12 2240

## 2012-09-27 NOTE — ED Provider Notes (Signed)
:  Pt with redness and mild small blister to the RLE anteriorly, well appaering, no tachycardia or fever, no sigsn of sepsis, add abx to cover MRSA f/u and return PRN, I personally reviewed findngs and plan with pt and familyh.  Medical screening examination/treatment/procedure(s) were conducted as a shared visit with non-physician practitioner(s) and myself.  I personally evaluated the patient during the encounter    Vida Roller, MD 09/27/12 [phone]

## 2012-12-10 ENCOUNTER — Other Ambulatory Visit: Payer: Self-pay

## 2012-12-24 ENCOUNTER — Encounter: Payer: Self-pay | Admitting: Internal Medicine

## 2012-12-24 ENCOUNTER — Ambulatory Visit (INDEPENDENT_AMBULATORY_CARE_PROVIDER_SITE_OTHER): Payer: Medicare Other | Admitting: Internal Medicine

## 2012-12-24 VITALS — BP 134/78 | HR 66 | Temp 98.1°F | Resp 14 | Ht 69.0 in | Wt 327.6 lb

## 2012-12-24 DIAGNOSIS — I82401 Acute embolism and thrombosis of unspecified deep veins of right lower extremity: Secondary | ICD-10-CM

## 2012-12-24 DIAGNOSIS — Z0000 Encounter for general adult medical examination without abnormal findings: Secondary | ICD-10-CM | POA: Insufficient documentation

## 2012-12-24 DIAGNOSIS — I10 Essential (primary) hypertension: Secondary | ICD-10-CM | POA: Diagnosis not present

## 2012-12-24 DIAGNOSIS — B3789 Other sites of candidiasis: Secondary | ICD-10-CM | POA: Insufficient documentation

## 2012-12-24 DIAGNOSIS — Z7901 Long term (current) use of anticoagulants: Secondary | ICD-10-CM | POA: Diagnosis not present

## 2012-12-24 DIAGNOSIS — M1710 Unilateral primary osteoarthritis, unspecified knee: Secondary | ICD-10-CM | POA: Insufficient documentation

## 2012-12-24 DIAGNOSIS — I82409 Acute embolism and thrombosis of unspecified deep veins of unspecified lower extremity: Secondary | ICD-10-CM

## 2012-12-24 DIAGNOSIS — R7309 Other abnormal glucose: Secondary | ICD-10-CM

## 2012-12-24 DIAGNOSIS — G4733 Obstructive sleep apnea (adult) (pediatric): Secondary | ICD-10-CM | POA: Insufficient documentation

## 2012-12-24 DIAGNOSIS — E785 Hyperlipidemia, unspecified: Secondary | ICD-10-CM

## 2012-12-24 DIAGNOSIS — R7303 Prediabetes: Secondary | ICD-10-CM | POA: Insufficient documentation

## 2012-12-24 DIAGNOSIS — F329 Major depressive disorder, single episode, unspecified: Secondary | ICD-10-CM

## 2012-12-24 MED ORDER — NYSTATIN 100000 UNIT/GM EX POWD: CUTANEOUS | Status: AC

## 2012-12-24 NOTE — Progress Notes (Signed)
Patient ID: Anthony Strickland, male   DOB: June 07, 1945, 67 y.o.   MRN: [id number]    PCP: Lenoard Aden, NP  Code Status: living will  No Known Allergies  Chief Complaint: annual exam  HPI:  68 y/o male patient follows with Candelaria Celeste, NP. He is here for his annual visit (first visit with me). Tries to exercise on daily basis but his knee and back pain limits him. He has noticed a rash in his groin area associated with itching He has more of carbohydrate in his diet. He has tried to incorporate more vegetables recently  Review of Systems  Constitutional: Negative for fever, chills and diaphoresis.  HENT: Negative for congestion.        Recently treated for asthma exacerbation in [place]  Eyes: Negative for blurred vision, double vision, photophobia and redness.       Last saw eye doctor less than a year ago  Respiratory: Negative for cough, hemoptysis, shortness of breath and wheezing.   Cardiovascular: Negative for chest pain, palpitations and orthopnea.       Uses 2 pillows to sleep at night. Uses CPAP at night  Gastrointestinal: Negative for heartburn, nausea, vomiting, abdominal pain, constipation and melena.       Colonoscopy 3 years back was normal, no polyp identified. Next one due in 2 years  Genitourinary: Negative for dysuria, frequency and flank pain.       Recent uti treated with bactrim ( a week course). No further symptoms  Musculoskeletal: Positive for joint pain. Negative for falls.       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       Groin area rash associated with itching  Neurological: Negative for dizziness, focal weakness, seizures, loss of consciousness and headaches.  Endo/Heme/Allergies: Does not bruise/bleed easily.  Psychiatric/Behavioral: Negative for depression, memory loss and substance abuse. The patient is not nervous/anxious and does not have insomnia.        Stable mood with current medications     Past Medical History   Diagnosis Date  . Asthma   . Rheumatoid arthritis(714.0)   . Hypertension   . Depression   . Hyperlipidemia   . DVT (deep venous thrombosis)   . History of recurrent UTIs   . PTSD (post-traumatic stress disorder)   . BPH (benign prostatic hyperplasia)   . CAD (coronary artery disease)    Past Surgical History  Procedure Laterality Date  . Coronary stent placement    . Knee arthroscopy  04/23/2012    Procedure: ARTHROSCOPY KNEE;  Surgeon: Mable Paris, MD;  Location: [facility];  Service: Orthopedics;  Laterality: Right;  . Cardiac surgery  2009    stent placement   Social History:   reports that he has quit smoking. His smoking use included Cigarettes. He has a 3 pack-year smoking history. He has never used smokeless tobacco. He reports that he does not drink alcohol or use illicit drugs.  Family History  Problem Relation Age of Onset  . Hypertension Mother   . Other Father     Medications: Patient's Medications  New Prescriptions   No medications on file  Previous Medications   ALBUTEROL (PROVENTIL HFA;VENTOLIN HFA) 108 (90 BASE) MCG/ACT INHALER    Inhale two puffs four times daily as needed for asthma   ASPIRIN 81 MG CHEWABLE TABLET    Chew 81 mg by mouth daily.   BENAZEPRIL (LOTENSIN) 40 MG TABLET    Take 40  mg by mouth daily. For bp   CARBOXYMETHYLCELLULOSE (REFRESH PLUS) 0.5 % SOLN    One drop both eyes four times daily   CHOLECALCIFEROL (VITAMIN D) 2000 UNITS CAPS    Take one tablet once daily   CITALOPRAM (CELEXA) 40 MG TABLET    Take 40 mg by mouth daily.   FUROSEMIDE (LASIX) 20 MG TABLET    Take three tablets twice daily for swelling   GABAPENTIN (NEURONTIN) 100 MG CAPSULE    Take 200-400 mg by mouth at bedtime. Takes 200mg  in the morning and takes 400mg  in the evening   LORATADINE (CLARITIN) 10 MG TABLET    Take 10 mg by mouth daily.   METOPROLOL (LOPRESSOR) 50 MG TABLET    Take 50 mg by mouth 2 (two) times daily.    NITROGLYCERIN (NITROSTAT) 0.6 MG SL  TABLET    Place 0.6 mg under the tongue every 5 (five) minutes as needed.   POTASSIUM CHLORIDE (MICRO-K) 10 MEQ CR CAPSULE    Take 10 mEq by mouth daily.    PRAVASTATIN (PRAVACHOL) 20 MG TABLET    Take 20 mg by mouth daily.   RIVAROXABAN (XARELTO) 20 MG TABS    Take 20 mg by mouth daily with supper.    TRAZODONE (DESYREL) 300 MG TABLET    Take 300 mg by mouth at bedtime.   VARDENAFIL (LEVITRA) 20 MG TABLET    Take 20 mg by mouth daily as needed for erectile dysfunction.  Modified Medications   No medications on file  Discontinued Medications   AMOXICILLIN-CLAVULANATE (AUGMENTIN) 875-125 MG PER TABLET    Take 1 tablet by mouth 2 (two) times daily. Take one tablet twice a day for bacteria infection.   PREDNISONE (DELTASONE) 10 MG TABLET    Take 1 tablet (10 mg total) by mouth daily. Take 6 tablets by mouth for 7 days then decrease by 10 mg daily until done   SULFAMETHOXAZOLE-TRIMETHOPRIM (BACTRIM DS) 800-160 MG PER TABLET    Take 1 tablet by mouth 2 (two) times daily.     Physical Exam: Filed Vitals:   12/24/12 0849  BP: 134/78  Pulse: 66  Temp: 98.1 F (36.7 C)  TempSrc: Oral  Resp: 14  Height: 5\' 9"  (1.753 m)  Weight: 327 lb 9.6 oz (148.598 kg)    Constitutional: He is oriented to person, place, and time. He appears well-developed and well-nourished. No distress.  HENT:   Head: Normocephalic.  Ears: normal bilaterally Eyes: EOM are normal. Pupils are equal, round, and reactive to light.  Cardiovascular: Normal rate, regular rhythm and normal heart sounds.   Pulmonary/Chest: Effort normal and breath sounds normal.  Abdominal: Soft. Bowel sounds are normal.  Musculoskeletal: He exhibits edema. Crepitus in both knees. Limited ROM from body habitus and arthritis Neurological: He is alert and oriented to person, place, and time. Normal sensation and reflexes Skin: Skin is warm and dry. He is not diaphoretic. Moist area in left and right groin area with redness  Psychiatric: He has a  normal mood and affect.   Labs reviewed: Basic Metabolic Panel:  Recent Labs  40/98/11 0517 09/22/12 1147 09/26/12 1752 09/26/12 2236  NA 136 140 139 137  K 3.6 4.1 3.9 4.0  CL 98 97 99 101  CO2 31 26 28   --   GLUCOSE 149* 108* 113* 179*  BUN 10 17 15 18   CREATININE 0.88 0.86 0.92 1.10  CALCIUM 7.9* 9.2 9.2  --    Liver Function Tests:  Recent  Labs  04/21/12 1610 04/22/12 0525 09/22/12 1147 09/26/12 1752  AST 40* 103* 9 10  ALT 14 27 11 10   ALKPHOS 150* 124* 74 69  BILITOT 0.6 0.5 0.3 0.3  PROT 7.0 5.9* 6.4 7.2  ALBUMIN 2.4* 2.0*  --  3.3*   No results found for this basename: LIPASE, AMYLASE,  in the last 8760 hours No results found for this basename: AMMONIA,  in the last 8760 hours CBC:  Recent Labs  04/30/12 1533 09/22/12 1147 09/26/12 1752 09/26/12 2208 09/26/12 2236  WBC 18.6* 10.9* 12.9* 11.3*  --   NEUTROABS  --  8.4* 9.1*  --   --   HGB 10.9* 14.9 15.4 14.9 15.3  HCT 33.1* 44.8 44.4 43.1 45.0  MCV 92.2 89 88.8 88.5  --   PLT 451*  --  219 207  --    Cardiac Enzymes:  Recent Labs  04/21/12 1610  TROPONINI <0.30   CBG:  Recent Labs  05/01/12 1704 05/01/12 2035 05/02/12 0741  GLUCAP 152* 168* 187*   Recent a1c as per patient 5.8   Assessment/Plan  General exam- uptodate with pneumococcal vaccine. Will get flu vaccine this flu season from [place]. To get shingles vaccine at [facility] upcoming visit. Pt informed about benefits of this and understands it. No recent labs for review but pt had it recently at [place]. Requested copies. Dietary and exercise counselling provided  DVT- first episode due to immobilization 9 months back. Has been on anticoagulant for > 3 months. Will d/c xarelto. Mobilization encouraged  Morbid obesity- will provide nutrition referral. Calorie counting and ROM exercise encouraged.   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 Will need weight reduction prior to TKA. Continue  using rollator walker for now. Continue vitamin d supplement  Depression- continue citalopram and trazodone, mood remains stable  HTN- bp controlled. Will continue lasix, benazepril and b blocker. Continue ASA  Groin rash- will his body habitus and retaining excess moisture in his groin folds. Will have him apply nystatin powder after cleaning the area  Peripheral neuropathy- continue neurontin for now  Prediabetes- monitor a1c periodically for now. Lifestyle modification encouraged Agrees with dietary counselling  Hyperlipidemia- continue pravastatin for now

## 2013-01-27 ENCOUNTER — Ambulatory Visit: Payer: Non-veteran care | Admitting: *Deleted

## 2013-01-29 ENCOUNTER — Encounter: Payer: Medicare Other | Attending: Internal Medicine | Admitting: *Deleted

## 2013-01-29 ENCOUNTER — Encounter: Payer: Self-pay | Admitting: *Deleted

## 2013-01-29 VITALS — Ht 69.0 in | Wt 325.9 lb

## 2013-01-29 DIAGNOSIS — R7303 Prediabetes: Secondary | ICD-10-CM

## 2013-01-29 DIAGNOSIS — Z713 Dietary counseling and surveillance: Secondary | ICD-10-CM | POA: Diagnosis not present

## 2013-01-29 NOTE — Progress Notes (Signed)
Medical Nutrition Therapy:  Appt start time: 1415 end time:  1530.  Assessment:  Primary concern today: Weight management, pre-diabetes. Patient reports a desire to lose weight due to pending knee surgery. He is here today with his wife. They have made several healthy changes in the last month, including eliminating sweetened drinks and using healthier cooking methods. He has lost about 2 pounds in the last month. They state that eating healthy is challenging on a limited budget. Patient is limited in physical activity, but reports that his knee feels stronger and he wants to start exercising again. He has a gym at his apartment complex. We also discussed the Silver Sneakers program.   MEDICATIONS: Vitamin D, lasix, neurontin, potassium chloride, pravastatin   DIETARY INTAKE:   Usual eating pattern includes 2 meals and 2-3 snacks per day.  24-hr recall:  B ( AM): Eggs, cereal/oatmeal  Snk ( AM): None  L ( PM): None Snk ( PM): Sweets, fruit, cereal bar, nuts D ( PM): Rice/pasta, baked chicken/steak, vegetables/beans Snk ( PM): Same, ice cream Beverages: Unsweetened tea, Crystal Light  Usual physical activity: None currently  Estimated energy needs: 1800 calories 225 g carbohydrates 113 g protein 50 g fat  Progress Towards Goal(s):  In progress.   Nutritional Diagnosis:  [place]-3.3 Overweight/obesity As related to excessive energy intake and physical inactivity.  As evidenced by BMI 48.2.    Intervention:  Nutrition counseling. We discussed strategies for weight loss, including balancing nutrients (carbs, protein, fat), portion control, healthy snacks, and exercise.   Goals:  1. 1-2 pounds weight loss per week. Goal weight: 300 pounds.  2. Eat 3 balanced meals (lean protein, healthy starch, vegetables/fruit).  3. Monitor portion size, increase non-starchy vegetables.  4. Continue to avoid sweetened drinks.  5. Start exercising 2-3 days per week for as long as able. Work up to 3-4 days  weekly for 30 minutes.   Handouts given during visit include:  Weight loss tips  Yellow meal plan card  Plate method  Monitoring/Evaluation:  Dietary intake, exercise, and body weight prn.

## 2013-03-12 ENCOUNTER — Other Ambulatory Visit: Payer: Self-pay

## 2013-03-24 ENCOUNTER — Ambulatory Visit: Payer: Medicare Other | Admitting: Nurse Practitioner

## 2013-03-26 ENCOUNTER — Ambulatory Visit: Payer: Medicare Other | Admitting: Nurse Practitioner

## 2013-03-26 DIAGNOSIS — Z0289 Encounter for other administrative examinations: Secondary | ICD-10-CM

## 2013-09-21 ENCOUNTER — Telehealth: Payer: Self-pay

## 2013-09-21 NOTE — Telephone Encounter (Signed)
Left message on voicemail for patient to return call when available   

## 2013-09-21 NOTE — Telephone Encounter (Signed)
Message copied by Maurice Small on Mon Sep 21, 2013 10:15 AM ------      Message from: Claudie Revering      Created: Mon Sep 21, 2013  9:52 AM       Pt has not been seen in the office since sept 2014! Is he being seen by another provider? Is he still on coumadin?       ----- Message -----         From: SYSTEM         Sent: 09/21/2013  12:01 AM           To: Claudie Revering, NP                   ------

## 2013-09-21 NOTE — Telephone Encounter (Signed)
Spoke with patient, patient states Anthony Strickland is his PCP and he is no longer on Coumadin. Patient is on [place]. Patient was informed he needs appointment to follow-up. Patient states he is on his was out of town to [place] because his parents is very ill. Patient states he will call once he gets back from [place] to schedule follow.  Message will be sent to [facility] [place] [facility] (NP) as a Lorain Childes

## 2014-01-12 ENCOUNTER — Telehealth: Payer: Self-pay | Admitting: Nurse Practitioner

## 2014-01-12 NOTE — Telephone Encounter (Signed)
Called pt regarding an appointment.  His last visit was in 2014... No Ans-sending letter...cdavis

## 2014-05-10 ENCOUNTER — Encounter (HOSPITAL_COMMUNITY): Payer: Self-pay | Admitting: *Deleted

## 2014-05-10 ENCOUNTER — Emergency Department (HOSPITAL_COMMUNITY)
Admission: EM | Admit: 2014-05-10 | Discharge: 2014-05-10 | Disposition: A | Discharge status: Home / Self Care | Payer: Medicare Other | Attending: Emergency Medicine | Admitting: Emergency Medicine

## 2014-05-10 DIAGNOSIS — I2510 Atherosclerotic heart disease of native coronary artery without angina pectoris: Secondary | ICD-10-CM | POA: Diagnosis not present

## 2014-05-10 DIAGNOSIS — E785 Hyperlipidemia, unspecified: Secondary | ICD-10-CM | POA: Diagnosis not present

## 2014-05-10 DIAGNOSIS — M7989 Other specified soft tissue disorders: Secondary | ICD-10-CM | POA: Diagnosis not present

## 2014-05-10 DIAGNOSIS — Z87438 Personal history of other diseases of male genital organs: Secondary | ICD-10-CM | POA: Diagnosis not present

## 2014-05-10 DIAGNOSIS — Z87440 Personal history of urinary (tract) infections: Secondary | ICD-10-CM | POA: Diagnosis not present

## 2014-05-10 DIAGNOSIS — J45909 Unspecified asthma, uncomplicated: Secondary | ICD-10-CM | POA: Diagnosis not present

## 2014-05-10 DIAGNOSIS — F4310 Post-traumatic stress disorder, unspecified: Secondary | ICD-10-CM | POA: Diagnosis not present

## 2014-05-10 DIAGNOSIS — L03115 Cellulitis of right lower limb: Secondary | ICD-10-CM | POA: Diagnosis not present

## 2014-05-10 DIAGNOSIS — F329 Major depressive disorder, single episode, unspecified: Secondary | ICD-10-CM | POA: Diagnosis not present

## 2014-05-10 DIAGNOSIS — Z792 Long term (current) use of antibiotics: Secondary | ICD-10-CM | POA: Diagnosis not present

## 2014-05-10 DIAGNOSIS — Z87891 Personal history of nicotine dependence: Secondary | ICD-10-CM | POA: Insufficient documentation

## 2014-05-10 DIAGNOSIS — M069 Rheumatoid arthritis, unspecified: Secondary | ICD-10-CM | POA: Insufficient documentation

## 2014-05-10 DIAGNOSIS — Z86718 Personal history of other venous thrombosis and embolism: Secondary | ICD-10-CM | POA: Diagnosis not present

## 2014-05-10 DIAGNOSIS — Z79899 Other long term (current) drug therapy: Secondary | ICD-10-CM | POA: Diagnosis not present

## 2014-05-10 DIAGNOSIS — I10 Essential (primary) hypertension: Secondary | ICD-10-CM | POA: Diagnosis not present

## 2014-05-10 DIAGNOSIS — M79609 Pain in unspecified limb: Secondary | ICD-10-CM | POA: Diagnosis not present

## 2014-05-10 DIAGNOSIS — Z7982 Long term (current) use of aspirin: Secondary | ICD-10-CM | POA: Insufficient documentation

## 2014-05-10 LAB — CBC
HEMATOCRIT: 41.8 % (ref 39.0–52.0)
HEMOGLOBIN: 14.5 g/dL (ref 13.0–17.0)
MCH: 31.3 pg (ref 26.0–34.0)
MCHC: 34.7 g/dL (ref 30.0–36.0)
MCV: 90.3 fL (ref 78.0–100.0)
Platelets: 206 10*3/uL (ref 150–400)
RBC: 4.63 MIL/uL (ref 4.22–5.81)
RDW: 12.8 % (ref 11.5–15.5)
WBC: 6.7 10*3/uL (ref 4.0–10.5)

## 2014-05-10 LAB — PROTIME-INR
INR: 1.08 (ref 0.00–1.49)
Prothrombin Time: 14.1 seconds (ref 11.6–15.2)

## 2014-05-10 LAB — BASIC METABOLIC PANEL
ANION GAP: 10 (ref 5–15)
BUN: 6 mg/dL (ref 6–23)
CALCIUM: 8.3 mg/dL — AB (ref 8.4–10.5)
CO2: 27 mmol/L (ref 19–32)
Chloride: 98 mEq/L (ref 96–112)
Creatinine, Ser: 0.85 mg/dL (ref 0.50–1.35)
GFR, EST NON AFRICAN AMERICAN: 88 mL/min — AB (ref >90)
GLUCOSE: 178 mg/dL — AB (ref 70–99)
POTASSIUM: 4 mmol/L (ref 3.5–5.1)
SODIUM: 135 mmol/L (ref 135–145)

## 2014-05-10 MED ORDER — OXYCODONE-ACETAMINOPHEN 5-325 MG PO TABS
1.0000 | ORAL_TABLET | Freq: Once | ORAL | Status: AC
  Administered 2014-05-10: 1 via ORAL
  Filled 2014-05-10: qty 1

## 2014-05-10 MED ORDER — CLINDAMYCIN HCL 300 MG PO CAPS: 300.0000 mg | ORAL_CAPSULE | Freq: Four times a day (QID) | ORAL | Status: AC

## 2014-05-10 NOTE — ED Notes (Signed)
Pt reports getting amoxicillin from [place], taking it for 6 days reports improved pan and improved swelling

## 2014-05-10 NOTE — Progress Notes (Signed)
Bilateral lower extremity venous duplex completed:  No evidence of DVT, superficial thrombosis, or Baker's cyst.   

## 2014-05-10 NOTE — ED Provider Notes (Signed)
PROGRESS NOTE                                                                                                                 This is a sign-out from PA Hess at shift change: Anthony Strickland is a 69 y.o. male presenting with right lower extremity erythema and pain worsening over the course of 2 weeks. [facility], he was diagnosed with cellulitis and started on 10 days of amoxicillin he has finished 6 days so far. Reports the erythema and pain are improving however not fully resolved. Patient has history of prior DVT in that leg 2 years ago, he is not anticoagulated. Plan is to follow-up venous duplex to rule out DVT and discharged home with clindamycin if negative.  Please refer to previous note for full HPI, ROS, PMH and PE.   DVT study is negative, patient seen and evaluated the bedside, explained results and plan. Return precautions discussed. Patient verbalizes understanding.  Wynetta Emery, PA-C 05/11/14 0105  Candyce Churn III, MD 05/11/14 [phone]

## 2014-05-10 NOTE — ED Notes (Signed)
Patient transported to duplex.

## 2014-05-10 NOTE — ED Provider Notes (Signed)
CSN: [id number]     Arrival date & time 05/10/14  1047 History   First MD Initiated Contact with Patient 05/10/14 1425     Chief Complaint  Patient presents with  . Leg Pain  . Cellulitis     (Consider location/radiation/quality/duration/timing/severity/associated sxs/prior Treatment) HPI Comments: 69 y/o morbidly obese male with PMHx of CAD, DVT (2013, no longer anti-coagulated), HTN, RA, asthma and depression presenting with right lower leg redness and swelling x 2 weeks. Pt was seen at the [place] 1 week ago, diagnosed with cellulitis, and started on 10 days of amoxicillin which he's had 6 days of so far. He states the swelling, pain and redness has started to improve, however still experiencing some discomfort and concerned the infection has not improved enough. Initially he had a subjective fever which has since subsided. He has been elevating his leg at night which helps with the swelling. Denies numbness or tingling.  Patient is a 69 y.o. male presenting with leg pain. The history is provided by the patient.  Leg Pain   Past Medical History  Diagnosis Date  . Asthma   . Rheumatoid arthritis(714.0)   . Hypertension   . Depression   . Hyperlipidemia   . DVT (deep venous thrombosis)   . History of recurrent UTIs   . PTSD (post-traumatic stress disorder)   . BPH (benign prostatic hyperplasia)   . CAD (coronary artery disease)    Past Surgical History  Procedure Laterality Date  . Coronary stent placement    . Knee arthroscopy  04/23/2012    Procedure: ARTHROSCOPY KNEE;  Surgeon: Mable Paris, MD;  Location: [facility] OR;  Service: Orthopedics;  Laterality: Right;  . Cardiac surgery  2009    stent placement   Family History  Problem Relation Age of Onset  . Hypertension Mother   . Other Father    History  Substance Use Topics  . Smoking status: Former Smoker -- 0.25 packs/day for 12 years    Types: Cigarettes  . Smokeless tobacco: Never Used  . Alcohol Use: No     Review of Systems  Musculoskeletal:       + RLE swelling and redness.  Skin: Positive for color change.  All other systems reviewed and are negative.     Allergies  Review of patient's allergies indicates no known allergies.  Home Medications   Prior to Admission medications   Medication Sig Start Date End Date Taking? Authorizing Provider  albuterol (PROVENTIL HFA;VENTOLIN HFA) 108 (90 BASE) MCG/ACT inhaler Inhale two puffs four times daily as needed for asthma   Yes Historical Provider, MD  amoxicillin (AMOXIL) 500 MG capsule Take 1,000 mg by mouth 2 (two) times daily. For 10 days. Filled on 05-05-14   Yes Historical Provider, MD  aspirin 81 MG chewable tablet Chew 81 mg by mouth daily.   Yes Historical Provider, MD  benazepril (LOTENSIN) 40 MG tablet Take 40 mg by mouth daily. For bp   Yes Historical Provider, MD  carboxymethylcellulose (REFRESH PLUS) 0.5 % SOLN One drop both eyes four times daily   Yes Historical Provider, MD  citalopram (CELEXA) 40 MG tablet Take 40 mg by mouth daily.   Yes Historical Provider, MD  folic acid (FOLVITE) 1 MG tablet Take 1 mg by mouth daily.   Yes Historical Provider, MD  furosemide (LASIX) 20 MG tablet Take three tablets twice daily for swelling   Yes Historical Provider, MD  gabapentin (NEURONTIN) 100 MG capsule Take 200-400 mg by mouth  at bedtime. Takes 200mg  in the morning and takes 400mg  in the evening   Yes Historical Provider, MD  loratadine (CLARITIN) 10 MG tablet Take 10 mg by mouth daily.   Yes Historical Provider, MD  metoprolol (LOPRESSOR) 50 MG tablet Take 50 mg by mouth 2 (two) times daily.    Yes Historical Provider, MD  nystatin (MYCOSTATIN/NYSTOP) 100000 UNIT/GM POWD Apply to groin area once a day 12/24/12  Yes Mahima Pandey, MD  pantoprazole (PROTONIX) 40 MG tablet Take 40 mg by mouth daily.   Yes Historical Provider, MD  potassium chloride (MICRO-K) 10 MEQ CR capsule Take 10 mEq by mouth daily.    Yes Historical Provider, MD   pravastatin (PRAVACHOL) 20 MG tablet Take 20 mg by mouth daily.   Yes Historical Provider, MD  prazosin (MINIPRESS) 1 MG capsule Take 1 mg by mouth at bedtime.   Yes Historical Provider, MD  trazodone (DESYREL) 300 MG tablet Take 300 mg by mouth at bedtime.   Yes Historical Provider, MD  vardenafil (LEVITRA) 20 MG tablet Take 20 mg by mouth daily as needed for erectile dysfunction.   Yes Historical Provider, MD  clindamycin (CLEOCIN) 300 MG capsule Take 1 capsule (300 mg total) by mouth 4 (four) times daily. X 7 days 05/10/14   12/26/12, PA-C  nitroGLYCERIN (NITROSTAT) 0.6 MG SL tablet Place 0.6 mg under the tongue every 5 (five) minutes as needed.    Historical Provider, MD   BP 136/82 mmHg  Pulse 73  Temp(Src) 98.2 F (36.8 C) (Oral)  Resp 14  Ht 5\' 8"  (1.727 m)  Wt 349 lb 7 oz (158.504 kg)  BMI 53.14 kg/m2  SpO2 100% Physical Exam  Constitutional: He is oriented to person, place, and time. He appears well-developed and well-nourished. No distress.  Morbidly obese.  HENT:  Head: Normocephalic and atraumatic.  Eyes: Conjunctivae and EOM are normal.  Neck: Normal range of motion. Neck supple.  Cardiovascular: Normal rate, regular rhythm and normal heart sounds.   Pulses:      Dorsalis pedis pulses are 1+ on the right side, and 1+ on the left side.       Posterior tibial pulses are 1+ on the right side, and 1+ on the left side.  Pulmonary/Chest: Effort normal and breath sounds normal.  Musculoskeletal: Normal range of motion.       Right lower leg: He exhibits edema (+2 pitting).       Legs: RLE larger than LLE. Tenderness over area of erythema. Negative Homan's.  Neurological: He is alert and oriented to person, place, and time.  Skin: Skin is warm and dry.  Psychiatric: He has a normal mood and affect. His behavior is normal.  Nursing note and vitals reviewed.   ED Course  Procedures (including critical care time) Labs Review Labs Reviewed  BASIC METABOLIC PANEL -  Abnormal; Notable for the following:    Glucose, Bld 178 (*)    Calcium 8.3 (*)    GFR calc non Af Amer 88 (*)    All other components within normal limits  CBC  PROTIME-INR    Imaging Review No results found.   EKG Interpretation None      MDM   Final diagnoses:  Cellulitis of right leg   Patient in no apparent distress. Afebrile, vital signs stable. Recent diagnosis of cellulitis, prescribed amoxicillin. He has completed 6 out of the 10 days of amoxicillin. Significant improvement per patient of redness and swelling. No longer has any  fever. Concerned not the redness has not completely gone away and there is still some discomfort. History of DVT in that leg, cannot rule out DVT. Lower extremity venous duplex pending. Labs without acute finding. No leukocytosis. If the venous duplex negative for DVT, will discharge home with clindamycin and follow-up with PCP in 2 days for recheck. Patient signed out to [facility], PA-C at shift change with duplex pending.  Discussed with attending Dr. Loretha Stapler who agrees with plan of care.   Kathrynn Speed, PA-C 05/10/14 1558  Merrie Roof, MD 05/11/14 [phone]

## 2014-05-10 NOTE — ED Notes (Signed)
Pt has moderate swelling and redness to right lower leg. Reports recently going to ucc and given amoxicillin but no relief.

## 2014-05-10 NOTE — Discharge Instructions (Signed)
Take clindamycin 4 times daily for 1 week for your leg infection. Follow up with your primary care doctor in 2 days for recheck.  Cellulitis Cellulitis is an infection of the skin and the tissue beneath it. The infected area is usually red and tender. Cellulitis occurs most often in the arms and lower legs.  CAUSES  Cellulitis is caused by bacteria that enter the skin through cracks or cuts in the skin. The most common types of bacteria that cause cellulitis are staphylococci and streptococci. SIGNS AND SYMPTOMS   Redness and warmth.  Swelling.  Tenderness or pain.  Fever. DIAGNOSIS  Your health care provider can usually determine what is wrong based on a physical exam. Blood tests may also be done. TREATMENT  Treatment usually involves taking an antibiotic medicine. HOME CARE INSTRUCTIONS   Take your antibiotic medicine as directed by your health care provider. Finish the antibiotic even if you start to feel better.  Keep the infected arm or leg elevated to reduce swelling.  Apply a warm cloth to the affected area up to 4 times per day to relieve pain.  Take medicines only as directed by your health care provider.  Keep all follow-up visits as directed by your health care provider. SEEK MEDICAL CARE IF:   You notice red streaks coming from the infected area.  Your red area gets larger or turns dark in color.  Your bone or joint underneath the infected area becomes painful after the skin has healed.  Your infection returns in the same area or another area.  You notice a swollen bump in the infected area.  You develop new symptoms.  You have a fever. SEEK IMMEDIATE MEDICAL CARE IF:   You feel very sleepy.  You develop vomiting or diarrhea.  You have a general ill feeling (malaise) with muscle aches and pains. MAKE SURE YOU:   Understand these instructions.  Will watch your condition.  Will get help right away if you are not doing well or get worse. Document  Released: 01/31/2005 Document Revised: 09/07/2013 Document Reviewed: 07/09/2011 [facility] Patient Information 2015 [place], [place]. This information is not intended to replace advice given to you by your health care provider. Make sure you discuss any questions you have with your health care provider.

## 2015-04-04 DIAGNOSIS — R2689 Other abnormalities of gait and mobility: Secondary | ICD-10-CM | POA: Diagnosis not present

## 2015-04-04 DIAGNOSIS — M25561 Pain in right knee: Secondary | ICD-10-CM | POA: Diagnosis not present

## 2015-04-04 DIAGNOSIS — M170 Bilateral primary osteoarthritis of knee: Secondary | ICD-10-CM | POA: Diagnosis not present

## 2015-04-04 DIAGNOSIS — M25562 Pain in left knee: Secondary | ICD-10-CM | POA: Diagnosis not present

## 2015-04-12 DIAGNOSIS — M25562 Pain in left knee: Secondary | ICD-10-CM | POA: Diagnosis not present

## 2015-04-12 DIAGNOSIS — R2689 Other abnormalities of gait and mobility: Secondary | ICD-10-CM | POA: Diagnosis not present

## 2015-04-12 DIAGNOSIS — M170 Bilateral primary osteoarthritis of knee: Secondary | ICD-10-CM | POA: Diagnosis not present

## 2015-04-12 DIAGNOSIS — M25561 Pain in right knee: Secondary | ICD-10-CM | POA: Diagnosis not present

## 2015-04-12 DIAGNOSIS — M1711 Unilateral primary osteoarthritis, right knee: Secondary | ICD-10-CM | POA: Diagnosis not present

## 2015-04-14 DIAGNOSIS — M25562 Pain in left knee: Secondary | ICD-10-CM | POA: Diagnosis not present

## 2015-04-14 DIAGNOSIS — M1712 Unilateral primary osteoarthritis, left knee: Secondary | ICD-10-CM | POA: Diagnosis not present

## 2015-04-14 DIAGNOSIS — M25561 Pain in right knee: Secondary | ICD-10-CM | POA: Diagnosis not present

## 2015-04-14 DIAGNOSIS — R2689 Other abnormalities of gait and mobility: Secondary | ICD-10-CM | POA: Diagnosis not present

## 2015-04-14 DIAGNOSIS — M170 Bilateral primary osteoarthritis of knee: Secondary | ICD-10-CM | POA: Diagnosis not present

## 2015-04-19 DIAGNOSIS — M1711 Unilateral primary osteoarthritis, right knee: Secondary | ICD-10-CM | POA: Diagnosis not present

## 2015-04-19 DIAGNOSIS — M25561 Pain in right knee: Secondary | ICD-10-CM | POA: Diagnosis not present

## 2015-04-21 DIAGNOSIS — M170 Bilateral primary osteoarthritis of knee: Secondary | ICD-10-CM | POA: Diagnosis not present

## 2015-04-21 DIAGNOSIS — M1712 Unilateral primary osteoarthritis, left knee: Secondary | ICD-10-CM | POA: Diagnosis not present

## 2015-04-21 DIAGNOSIS — R2689 Other abnormalities of gait and mobility: Secondary | ICD-10-CM | POA: Diagnosis not present

## 2015-04-21 DIAGNOSIS — M25562 Pain in left knee: Secondary | ICD-10-CM | POA: Diagnosis not present

## 2015-04-21 DIAGNOSIS — M25561 Pain in right knee: Secondary | ICD-10-CM | POA: Diagnosis not present

## 2015-04-26 DIAGNOSIS — M1711 Unilateral primary osteoarthritis, right knee: Secondary | ICD-10-CM | POA: Diagnosis not present

## 2015-04-26 DIAGNOSIS — R2689 Other abnormalities of gait and mobility: Secondary | ICD-10-CM | POA: Diagnosis not present

## 2015-04-26 DIAGNOSIS — M170 Bilateral primary osteoarthritis of knee: Secondary | ICD-10-CM | POA: Diagnosis not present

## 2015-04-26 DIAGNOSIS — M25561 Pain in right knee: Secondary | ICD-10-CM | POA: Diagnosis not present

## 2015-04-26 DIAGNOSIS — M25562 Pain in left knee: Secondary | ICD-10-CM | POA: Diagnosis not present

## 2015-04-28 DIAGNOSIS — M170 Bilateral primary osteoarthritis of knee: Secondary | ICD-10-CM | POA: Diagnosis not present

## 2015-04-28 DIAGNOSIS — M25562 Pain in left knee: Secondary | ICD-10-CM | POA: Diagnosis not present

## 2015-04-28 DIAGNOSIS — M1712 Unilateral primary osteoarthritis, left knee: Secondary | ICD-10-CM | POA: Diagnosis not present

## 2015-04-28 DIAGNOSIS — M25561 Pain in right knee: Secondary | ICD-10-CM | POA: Diagnosis not present

## 2015-04-28 DIAGNOSIS — R2689 Other abnormalities of gait and mobility: Secondary | ICD-10-CM | POA: Diagnosis not present

## 2015-05-03 DIAGNOSIS — M170 Bilateral primary osteoarthritis of knee: Secondary | ICD-10-CM | POA: Diagnosis not present

## 2015-05-03 DIAGNOSIS — M25562 Pain in left knee: Secondary | ICD-10-CM | POA: Diagnosis not present

## 2015-05-03 DIAGNOSIS — M25561 Pain in right knee: Secondary | ICD-10-CM | POA: Diagnosis not present

## 2015-05-03 DIAGNOSIS — R2689 Other abnormalities of gait and mobility: Secondary | ICD-10-CM | POA: Diagnosis not present

## 2015-05-03 DIAGNOSIS — M1711 Unilateral primary osteoarthritis, right knee: Secondary | ICD-10-CM | POA: Diagnosis not present

## 2015-05-05 DIAGNOSIS — R2689 Other abnormalities of gait and mobility: Secondary | ICD-10-CM | POA: Diagnosis not present

## 2015-05-05 DIAGNOSIS — M25561 Pain in right knee: Secondary | ICD-10-CM | POA: Diagnosis not present

## 2015-05-05 DIAGNOSIS — M1712 Unilateral primary osteoarthritis, left knee: Secondary | ICD-10-CM | POA: Diagnosis not present

## 2015-05-05 DIAGNOSIS — M25562 Pain in left knee: Secondary | ICD-10-CM | POA: Diagnosis not present

## 2015-05-05 DIAGNOSIS — M170 Bilateral primary osteoarthritis of knee: Secondary | ICD-10-CM | POA: Diagnosis not present

## 2015-05-10 DIAGNOSIS — M25562 Pain in left knee: Secondary | ICD-10-CM | POA: Diagnosis not present

## 2015-05-10 DIAGNOSIS — M170 Bilateral primary osteoarthritis of knee: Secondary | ICD-10-CM | POA: Diagnosis not present

## 2015-05-10 DIAGNOSIS — M25561 Pain in right knee: Secondary | ICD-10-CM | POA: Diagnosis not present

## 2015-05-10 DIAGNOSIS — R2689 Other abnormalities of gait and mobility: Secondary | ICD-10-CM | POA: Diagnosis not present

## 2015-05-12 DIAGNOSIS — R2689 Other abnormalities of gait and mobility: Secondary | ICD-10-CM | POA: Diagnosis not present

## 2015-05-12 DIAGNOSIS — M25562 Pain in left knee: Secondary | ICD-10-CM | POA: Diagnosis not present

## 2015-05-12 DIAGNOSIS — M25561 Pain in right knee: Secondary | ICD-10-CM | POA: Diagnosis not present

## 2015-05-12 DIAGNOSIS — M170 Bilateral primary osteoarthritis of knee: Secondary | ICD-10-CM | POA: Diagnosis not present

## 2015-05-17 DIAGNOSIS — M25562 Pain in left knee: Secondary | ICD-10-CM | POA: Diagnosis not present

## 2015-05-17 DIAGNOSIS — M170 Bilateral primary osteoarthritis of knee: Secondary | ICD-10-CM | POA: Diagnosis not present

## 2015-05-17 DIAGNOSIS — R2689 Other abnormalities of gait and mobility: Secondary | ICD-10-CM | POA: Diagnosis not present

## 2015-05-17 DIAGNOSIS — M25561 Pain in right knee: Secondary | ICD-10-CM | POA: Diagnosis not present

## 2015-05-19 DIAGNOSIS — R2689 Other abnormalities of gait and mobility: Secondary | ICD-10-CM | POA: Diagnosis not present

## 2015-05-19 DIAGNOSIS — M1712 Unilateral primary osteoarthritis, left knee: Secondary | ICD-10-CM | POA: Diagnosis not present

## 2015-05-19 DIAGNOSIS — M25562 Pain in left knee: Secondary | ICD-10-CM | POA: Diagnosis not present

## 2015-05-19 DIAGNOSIS — M25561 Pain in right knee: Secondary | ICD-10-CM | POA: Diagnosis not present

## 2015-05-24 DIAGNOSIS — M170 Bilateral primary osteoarthritis of knee: Secondary | ICD-10-CM | POA: Diagnosis not present

## 2015-05-24 DIAGNOSIS — M25562 Pain in left knee: Secondary | ICD-10-CM | POA: Diagnosis not present

## 2015-05-24 DIAGNOSIS — M25561 Pain in right knee: Secondary | ICD-10-CM | POA: Diagnosis not present

## 2015-05-24 DIAGNOSIS — R2689 Other abnormalities of gait and mobility: Secondary | ICD-10-CM | POA: Diagnosis not present

## 2015-05-26 DIAGNOSIS — M25561 Pain in right knee: Secondary | ICD-10-CM | POA: Diagnosis not present

## 2015-05-26 DIAGNOSIS — R2689 Other abnormalities of gait and mobility: Secondary | ICD-10-CM | POA: Diagnosis not present

## 2015-05-26 DIAGNOSIS — M170 Bilateral primary osteoarthritis of knee: Secondary | ICD-10-CM | POA: Diagnosis not present

## 2015-05-26 DIAGNOSIS — M25562 Pain in left knee: Secondary | ICD-10-CM | POA: Diagnosis not present

## 2015-06-02 DIAGNOSIS — M25561 Pain in right knee: Secondary | ICD-10-CM | POA: Diagnosis not present

## 2015-06-02 DIAGNOSIS — M25562 Pain in left knee: Secondary | ICD-10-CM | POA: Diagnosis not present

## 2015-06-02 DIAGNOSIS — M170 Bilateral primary osteoarthritis of knee: Secondary | ICD-10-CM | POA: Diagnosis not present

## 2015-06-02 DIAGNOSIS — R2689 Other abnormalities of gait and mobility: Secondary | ICD-10-CM | POA: Diagnosis not present

## 2015-08-04 DIAGNOSIS — R262 Difficulty in walking, not elsewhere classified: Secondary | ICD-10-CM | POA: Diagnosis not present

## 2015-08-04 DIAGNOSIS — M170 Bilateral primary osteoarthritis of knee: Secondary | ICD-10-CM | POA: Diagnosis not present

## 2015-08-04 DIAGNOSIS — M25561 Pain in right knee: Secondary | ICD-10-CM | POA: Diagnosis not present

## 2015-08-04 DIAGNOSIS — M25562 Pain in left knee: Secondary | ICD-10-CM | POA: Diagnosis not present

## 2016-04-12 ENCOUNTER — Other Ambulatory Visit: Payer: Self-pay

## 2016-04-21 ENCOUNTER — Encounter (HOSPITAL_BASED_OUTPATIENT_CLINIC_OR_DEPARTMENT_OTHER): Payer: Self-pay | Admitting: Emergency Medicine

## 2016-04-21 DIAGNOSIS — I10 Essential (primary) hypertension: Secondary | ICD-10-CM | POA: Diagnosis not present

## 2016-04-21 DIAGNOSIS — I2510 Atherosclerotic heart disease of native coronary artery without angina pectoris: Secondary | ICD-10-CM | POA: Diagnosis not present

## 2016-04-21 DIAGNOSIS — J45909 Unspecified asthma, uncomplicated: Secondary | ICD-10-CM | POA: Insufficient documentation

## 2016-04-21 DIAGNOSIS — M25512 Pain in left shoulder: Secondary | ICD-10-CM | POA: Insufficient documentation

## 2016-04-21 DIAGNOSIS — Z7982 Long term (current) use of aspirin: Secondary | ICD-10-CM | POA: Insufficient documentation

## 2016-04-21 DIAGNOSIS — Z79899 Other long term (current) drug therapy: Secondary | ICD-10-CM | POA: Insufficient documentation

## 2016-04-21 DIAGNOSIS — R0789 Other chest pain: Secondary | ICD-10-CM | POA: Insufficient documentation

## 2016-04-21 DIAGNOSIS — Z87891 Personal history of nicotine dependence: Secondary | ICD-10-CM | POA: Insufficient documentation

## 2016-04-21 DIAGNOSIS — E119 Type 2 diabetes mellitus without complications: Secondary | ICD-10-CM | POA: Diagnosis not present

## 2016-04-21 NOTE — ED Triage Notes (Signed)
Patient states that he is having left shoulder pain x "months"  - the patient reports that his left shoulder and arm is hurting. The last week has worsened.

## 2016-04-22 ENCOUNTER — Emergency Department (HOSPITAL_BASED_OUTPATIENT_CLINIC_OR_DEPARTMENT_OTHER): Payer: Medicare Other

## 2016-04-22 ENCOUNTER — Emergency Department (HOSPITAL_BASED_OUTPATIENT_CLINIC_OR_DEPARTMENT_OTHER)
Admission: EM | Admit: 2016-04-22 | Discharge: 2016-04-22 | Disposition: A | Discharge status: Home / Self Care | Payer: Medicare Other | Attending: Emergency Medicine | Admitting: Emergency Medicine

## 2016-04-22 DIAGNOSIS — M25511 Pain in right shoulder: Secondary | ICD-10-CM

## 2016-04-22 DIAGNOSIS — R0789 Other chest pain: Secondary | ICD-10-CM

## 2016-04-22 LAB — CBC WITH DIFFERENTIAL/PLATELET
Basophils Absolute: 0 10*3/uL (ref 0.0–0.1)
Basophils Relative: 0 %
EOS ABS: 0.2 10*3/uL (ref 0.0–0.7)
Eosinophils Relative: 3 %
HEMATOCRIT: 42.5 % (ref 39.0–52.0)
HEMOGLOBIN: 14.8 g/dL (ref 13.0–17.0)
LYMPHS ABS: 2.5 10*3/uL (ref 0.7–4.0)
Lymphocytes Relative: 31 %
MCH: 32.7 pg (ref 26.0–34.0)
MCHC: 34.8 g/dL (ref 30.0–36.0)
MCV: 93.8 fL (ref 78.0–100.0)
MONO ABS: 0.6 10*3/uL (ref 0.1–1.0)
MONOS PCT: 8 %
NEUTROS PCT: 58 %
Neutro Abs: 4.7 10*3/uL (ref 1.7–7.7)
Platelets: ADEQUATE 10*3/uL (ref 150–400)
RBC: 4.53 MIL/uL (ref 4.22–5.81)
RDW: 12.8 % (ref 11.5–15.5)
WBC: 8.1 10*3/uL (ref 4.0–10.5)

## 2016-04-22 LAB — BASIC METABOLIC PANEL
Anion gap: 10 (ref 5–15)
BUN: 11 mg/dL (ref 6–20)
CALCIUM: 8.6 mg/dL — AB (ref 8.9–10.3)
CHLORIDE: 103 mmol/L (ref 101–111)
CO2: 27 mmol/L (ref 22–32)
CREATININE: 1.03 mg/dL (ref 0.61–1.24)
GFR calc non Af Amer: >60 mL/min (ref >60)
GLUCOSE: 114 mg/dL — AB (ref 65–99)
Potassium: 3.6 mmol/L (ref 3.5–5.1)
Sodium: 140 mmol/L (ref 135–145)

## 2016-04-22 MED ORDER — HYDROCODONE-ACETAMINOPHEN 5-325 MG PO TABS: 1.0000 | ORAL_TABLET | Freq: Four times a day (QID) | ORAL | 0 refills | Status: AC | PRN

## 2016-04-22 MED ORDER — HYDROCODONE-ACETAMINOPHEN 5-325 MG PO TABS
2.0000 | ORAL_TABLET | Freq: Once | ORAL | Status: AC
  Administered 2016-04-22: 2 via ORAL
  Filled 2016-04-22: qty 2

## 2016-04-22 MED ORDER — NAPROXEN 500 MG PO TABS: 500.0000 mg | ORAL_TABLET | Freq: Two times a day (BID) | ORAL | 0 refills | Status: AC

## 2016-04-22 MED ORDER — IOPAMIDOL (ISOVUE-370) INJECTION 76%
100.0000 mL | Freq: Once | INTRAVENOUS | Status: AC | PRN
  Administered 2016-04-22: 100 mL via INTRAVENOUS

## 2016-04-22 NOTE — Discharge Instructions (Signed)
Naproxen as prescribed.  Hydrocodone as prescribed as needed for pain not relieved with naproxen.  Follow-up with your primary Dr. if you're not improving in the next week to discuss further testing or possibly physical therapy.

## 2016-04-22 NOTE — ED Notes (Signed)
Missed IV  attempts to bil AC, tol well,

## 2016-04-22 NOTE — ED Notes (Signed)
IV attempt x 2 without success. Able to obtain blood for labs however.

## 2016-04-22 NOTE — ED Notes (Signed)
Transported to CT scan

## 2016-04-22 NOTE — ED Notes (Signed)
MD with pt  

## 2016-04-22 NOTE — ED Provider Notes (Signed)
[facility] Provider Note   CSN: [id number] Arrival date & time: 04/21/16  2207   By signing my name below, I, Nelwyn Salisbury, attest that this documentation has been prepared under the direction and in the presence of Geoffery Lyons, MD. Electronically Signed: Nelwyn Salisbury, Scribe. 04/22/2016. 12:28 AM.  History   Chief Complaint Chief Complaint  Patient presents with  . Shoulder Pain   The history is provided by the patient. No language interpreter was used.  Shoulder Pain   This is a chronic problem. The current episode started more than 1 week ago. The problem occurs constantly. The problem has been gradually worsening. The pain is present in the left shoulder, left arm and neck. The quality of the pain is described as aching and pounding. The pain is at a severity of 8/10. The pain is moderate. Associated symptoms include numbness. The symptoms are aggravated by lying down. He has tried OTC pain medications for the symptoms. There has been no history of extremity trauma.    HPI Comments:  Jvion Arzuaga is a 70 y.o. male with pmhx of DVT, CAD, HLD, DM and HTN who presents to the Emergency Department complaining of chronic constant worsened left shoulder pain beginning a few months ago.  Pt describes his symptoms as an 8/10 pounding, aching pain radiating down his left triceps and into the left side of his neck. He states is pain is worse at night and is relieved by sitting up and hot showers. Pt reports associated numbness in his left hand. He denies any CP or SOB. Pt is no longer compliant with his insulin, but has been taking his metformin.   Past Medical History:  Diagnosis Date  . Asthma   . BPH (benign prostatic hyperplasia)   . CAD (coronary artery disease)   . Depression   . DVT (deep venous thrombosis) (HCC)   . History of recurrent UTIs   . Hyperlipidemia   . Hypertension   . PTSD (post-traumatic stress disorder)   . Rheumatoid arthritis(714.0)      Patient Active Problem List   Diagnosis Date Noted  . Prediabetes 12/24/2012  . DJD (degenerative joint disease) of knee 12/24/2012  . Routine general medical examination at a health care facility 12/24/2012  . OSA on CPAP 12/24/2012  . Morbid obesity (HCC) 12/24/2012  . Candida rash of groin 12/24/2012  . Dysuria 09/22/2012  . Rash 08/19/2012  . Essential hypertension, benign 08/19/2012  . Other and unspecified hyperlipidemia 08/19/2012  . Diabetes mellitus (HCC) 04/25/2012  . DVT (deep venous thrombosis) (HCC) 04/24/2012  . Septic arthritis of knee (HCC) 04/23/2012  . Metabolic encephalopathy 04/22/2012  . Septic shock (HCC) 04/22/2012  . UTI (lower urinary tract infection) 04/22/2012  . Hyponatremia 04/22/2012  . Acute kidney injury (HCC) 04/22/2012  . Knee pain 04/22/2012    Past Surgical History:  Procedure Laterality Date  . CARDIAC SURGERY  2009   stent placement  . CORONARY STENT PLACEMENT    . KNEE ARTHROSCOPY  04/23/2012   Procedure: ARTHROSCOPY KNEE;  Surgeon: Mable Paris, MD;  Location: [facility] OR;  Service: Orthopedics;  Laterality: Right;     Home Medications    Prior to Admission medications   Medication Sig Start Date End Date Taking? Authorizing Provider  albuterol (PROVENTIL HFA;VENTOLIN HFA) 108 (90 BASE) MCG/ACT inhaler Inhale two puffs four times daily as needed for asthma    Historical Provider, MD  amoxicillin (AMOXIL) 500 MG capsule Take 1,000 mg by mouth 2 (  two) times daily. For 10 days. Filled on 05-05-14    Historical Provider, MD  aspirin 81 MG chewable tablet Chew 81 mg by mouth daily.    Historical Provider, MD  benazepril (LOTENSIN) 40 MG tablet Take 40 mg by mouth daily. For bp    Historical Provider, MD  carboxymethylcellulose (REFRESH PLUS) 0.5 % SOLN One drop both eyes four times daily    Historical Provider, MD  citalopram (CELEXA) 40 MG tablet Take 40 mg by mouth daily.    Historical Provider, MD  clindamycin (CLEOCIN) 300  MG capsule Take 1 capsule (300 mg total) by mouth 4 (four) times daily. X 7 days 05/10/14   Kathrynn Speed, PA-C  folic acid (FOLVITE) 1 MG tablet Take 1 mg by mouth daily.    Historical Provider, MD  furosemide (LASIX) 20 MG tablet Take three tablets twice daily for swelling    Historical Provider, MD  gabapentin (NEURONTIN) 100 MG capsule Take 200-400 mg by mouth at bedtime. Takes 200mg  in the morning and takes 400mg  in the evening    Historical Provider, MD  loratadine (CLARITIN) 10 MG tablet Take 10 mg by mouth daily.    Historical Provider, MD  metoprolol (LOPRESSOR) 50 MG tablet Take 50 mg by mouth 2 (two) times daily.     Historical Provider, MD  nitroGLYCERIN (NITROSTAT) 0.6 MG SL tablet Place 0.6 mg under the tongue every 5 (five) minutes as needed.    Historical Provider, MD  nystatin (MYCOSTATIN/NYSTOP) 100000 UNIT/GM POWD Apply to groin area once a day 12/24/12   , MD  pantoprazole (PROTONIX) 40 MG tablet Take 40 mg by mouth daily.    Historical Provider, MD  potassium chloride (MICRO-K) 10 MEQ CR capsule Take 10 mEq by mouth daily.     Historical Provider, MD  pravastatin (PRAVACHOL) 20 MG tablet Take 20 mg by mouth daily.    Historical Provider, MD  prazosin (MINIPRESS) 1 MG capsule Take 1 mg by mouth at bedtime.    Historical Provider, MD  trazodone (DESYREL) 300 MG tablet Take 300 mg by mouth at bedtime.    Historical Provider, MD  vardenafil (LEVITRA) 20 MG tablet Take 20 mg by mouth daily as needed for erectile dysfunction.    Historical Provider, MD    Family History Family History  Problem Relation Age of Onset  . Hypertension Mother   . Other Father     Social History Social History  Substance Use Topics  . Smoking status: Former Smoker    Packs/day: 0.25    Years: 12.00    Types: Cigarettes  . Smokeless tobacco: Never Used  . Alcohol use No     Allergies   Patient has no known allergies.   Review of Systems Review of Systems  Respiratory:  Negative for shortness of breath.   Cardiovascular: Negative for chest pain.  Musculoskeletal: Positive for arthralgias and myalgias.  Neurological: Positive for numbness.  All other systems reviewed and are negative.    Physical Exam Updated Vital Signs BP 132/98 (BP Location: Right Arm)   Pulse 73   Temp 98.2 F (36.8 C) (Oral)   Resp 18   Ht 5\' 8"  (1.727 m)   Wt (!) 309 lb (140.2 kg)   SpO2 96%   BMI 46.98 kg/m   Physical Exam  Constitutional: He is oriented to person, place, and time. He appears well-developed and well-nourished.  HENT:  Head: Normocephalic and atraumatic.  Eyes: EOM are normal.  Neck: Normal range  of motion.  Cardiovascular: Normal rate, regular rhythm, normal heart sounds and intact distal pulses.   Pulmonary/Chest: Effort normal and breath sounds normal. No respiratory distress.  Abdominal: Soft. He exhibits no distension. There is no tenderness.  Musculoskeletal: Normal range of motion.  TTP in the soft tissues inferior to the left scapula. Good ROM of the left shoulder with minimal pain. PMS intact to the left arm.   Neurological: He is alert and oriented to person, place, and time.  Skin: Skin is warm and dry.  Psychiatric: He has a normal mood and affect. Judgment normal.  Nursing note and vitals reviewed.    ED Treatments / Results  DIAGNOSTIC STUDIES:  Oxygen Saturation is 96% on RA, normal by my interpretation.    COORDINATION OF CARE:  12:36 AM Discussed treatment plan with pt at bedside which includes imaging and pt agreed to plan.  Labs (all labs ordered are listed, but only abnormal results are displayed) Labs Reviewed - No data to display  EKG  EKG Interpretation None       Radiology No results found.  Procedures Procedures (including critical care time)  Medications Ordered in ED Medications - No data to display   Initial Impression / Assessment and Plan / ED Course  I have reviewed the triage vital signs and  the nursing notes.  Pertinent labs & imaging results that were available during my care of the patient were reviewed by me and considered in my medical decision making (see chart for details).  Clinical Course     Patient presents here with complaints of pain in his right posterior chest wall and shoulder blade. This is been going on for several weeks and began in the absence of any injury or trauma. It is worse when he moves and breathes and tries to roll over in bed at night. He is concerned there may be a mass or some sort of other serious pathology. He did undergo a CT scan of his chest rule out blood clot or other intrathoracic abnormality. These were all negative. Laboratory studies are reassuring. I highly suspect a musculoskeletal etiology to his discomfort. He will be treated with naproxen, pain medication, and when necessary follow-up with his primary doctor. This could be a rotator cuff tendinitis, however his symptoms are somewhat atypical. He may require further imaging to further look into this if not improving.  Final Clinical Impressions(s) / ED Diagnoses   Final diagnoses:  None    New Prescriptions New Prescriptions   No medications on file  I personally performed the services described in this documentation, which was scribed in my presence. The recorded information has been reviewed and is accurate.        Geoffery Lyons, MD 04/22/16 [phone]

## 2016-04-22 NOTE — ED Notes (Signed)
Pain to left shoulder/scapula radiating to left elbow for the past year, has been to [place] for same and had xray's done, denies recent injury or fall . States had ingestion earlier tonight after eating hot sauce on food, denies chest pain or indigestion at present.

## 2016-04-22 NOTE — ED Notes (Signed)
Returned from CT.

## 2021-09-19 DIAGNOSIS — E785 Hyperlipidemia, unspecified: Secondary | ICD-10-CM | POA: Diagnosis not present

## 2021-09-19 DIAGNOSIS — E119 Type 2 diabetes mellitus without complications: Secondary | ICD-10-CM | POA: Diagnosis not present

## 2021-09-19 DIAGNOSIS — I10 Essential (primary) hypertension: Secondary | ICD-10-CM | POA: Diagnosis not present

## 2021-09-19 DIAGNOSIS — F32A Depression, unspecified: Secondary | ICD-10-CM | POA: Diagnosis not present

## 2021-09-28 DIAGNOSIS — K219 Gastro-esophageal reflux disease without esophagitis: Secondary | ICD-10-CM | POA: Diagnosis not present

## 2021-09-28 DIAGNOSIS — E119 Type 2 diabetes mellitus without complications: Secondary | ICD-10-CM | POA: Diagnosis not present

## 2021-09-28 DIAGNOSIS — I509 Heart failure, unspecified: Secondary | ICD-10-CM | POA: Diagnosis not present

## 2021-09-28 DIAGNOSIS — I10 Essential (primary) hypertension: Secondary | ICD-10-CM | POA: Diagnosis not present

## 2021-10-03 ENCOUNTER — Emergency Department (HOSPITAL_COMMUNITY)
Admission: EM | Admit: 2021-10-03 | Discharge: 2021-10-03 | Disposition: A | Discharge status: Home / Self Care | Payer: No Typology Code available for payment source | Attending: Emergency Medicine | Admitting: Emergency Medicine

## 2021-10-03 ENCOUNTER — Emergency Department (HOSPITAL_COMMUNITY): Payer: No Typology Code available for payment source

## 2021-10-03 ENCOUNTER — Emergency Department (HOSPITAL_BASED_OUTPATIENT_CLINIC_OR_DEPARTMENT_OTHER): Payer: No Typology Code available for payment source

## 2021-10-03 ENCOUNTER — Other Ambulatory Visit: Payer: Self-pay

## 2021-10-03 DIAGNOSIS — M7989 Other specified soft tissue disorders: Secondary | ICD-10-CM

## 2021-10-03 DIAGNOSIS — R600 Localized edema: Secondary | ICD-10-CM | POA: Diagnosis not present

## 2021-10-03 DIAGNOSIS — R0602 Shortness of breath: Secondary | ICD-10-CM | POA: Insufficient documentation

## 2021-10-03 DIAGNOSIS — Z7982 Long term (current) use of aspirin: Secondary | ICD-10-CM | POA: Diagnosis not present

## 2021-10-03 DIAGNOSIS — R609 Edema, unspecified: Secondary | ICD-10-CM

## 2021-10-03 DIAGNOSIS — M25562 Pain in left knee: Secondary | ICD-10-CM | POA: Diagnosis not present

## 2021-10-03 DIAGNOSIS — M25561 Pain in right knee: Secondary | ICD-10-CM | POA: Insufficient documentation

## 2021-10-03 DIAGNOSIS — M79604 Pain in right leg: Secondary | ICD-10-CM

## 2021-10-03 DIAGNOSIS — M545 Low back pain, unspecified: Secondary | ICD-10-CM | POA: Insufficient documentation

## 2021-10-03 DIAGNOSIS — I10 Essential (primary) hypertension: Secondary | ICD-10-CM | POA: Insufficient documentation

## 2021-10-03 DIAGNOSIS — R2243 Localized swelling, mass and lump, lower limb, bilateral: Secondary | ICD-10-CM | POA: Diagnosis not present

## 2021-10-03 DIAGNOSIS — I2510 Atherosclerotic heart disease of native coronary artery without angina pectoris: Secondary | ICD-10-CM | POA: Diagnosis not present

## 2021-10-03 DIAGNOSIS — Z79899 Other long term (current) drug therapy: Secondary | ICD-10-CM | POA: Insufficient documentation

## 2021-10-03 LAB — COMPREHENSIVE METABOLIC PANEL
ALT: 15 U/L (ref 0–44)
AST: 17 U/L (ref 15–41)
Albumin: 3.8 g/dL (ref 3.5–5.0)
Alkaline Phosphatase: 73 U/L (ref 38–126)
Anion gap: 8 (ref 5–15)
BUN: 12 mg/dL (ref 8–23)
CO2: 27 mmol/L (ref 22–32)
Calcium: 9 mg/dL (ref 8.9–10.3)
Chloride: 105 mmol/L (ref 98–111)
Creatinine, Ser: 0.92 mg/dL (ref 0.61–1.24)
GFR, Estimated: >60 mL/min (ref >60)
Glucose, Bld: 110 mg/dL — ABNORMAL HIGH (ref 70–99)
Potassium: 4.5 mmol/L (ref 3.5–5.1)
Sodium: 140 mmol/L (ref 135–145)
Total Bilirubin: 0.8 mg/dL (ref 0.3–1.2)
Total Protein: 6.8 g/dL (ref 6.5–8.1)

## 2021-10-03 LAB — CBC WITH DIFFERENTIAL/PLATELET
Abs Immature Granulocytes: 0.03 10*3/uL (ref 0.00–0.07)
Basophils Absolute: 0 10*3/uL (ref 0.0–0.1)
Basophils Relative: 0 %
Eosinophils Absolute: 0.2 10*3/uL (ref 0.0–0.5)
Eosinophils Relative: 2 %
HCT: 46.3 % (ref 39.0–52.0)
Hemoglobin: 15.7 g/dL (ref 13.0–17.0)
Immature Granulocytes: 0 %
Lymphocytes Relative: 24 %
Lymphs Abs: 1.8 10*3/uL (ref 0.7–4.0)
MCH: 32.2 pg (ref 26.0–34.0)
MCHC: 33.9 g/dL (ref 30.0–36.0)
MCV: 95.1 fL (ref 80.0–100.0)
Monocytes Absolute: 0.6 10*3/uL (ref 0.1–1.0)
Monocytes Relative: 8 %
Neutro Abs: 4.7 10*3/uL (ref 1.7–7.7)
Neutrophils Relative %: 66 %
Platelets: 209 10*3/uL (ref 150–400)
RBC: 4.87 MIL/uL (ref 4.22–5.81)
RDW: 12.4 % (ref 11.5–15.5)
WBC: 7.3 10*3/uL (ref 4.0–10.5)
nRBC: 0 % (ref 0.0–0.2)

## 2021-10-03 LAB — TROPONIN I (HIGH SENSITIVITY)
Troponin I (High Sensitivity): 4 ng/L (ref <18)
Troponin I (High Sensitivity): 5 ng/L (ref <18)

## 2021-10-03 LAB — BRAIN NATRIURETIC PEPTIDE: B Natriuretic Peptide: 39 pg/mL (ref 0.0–100.0)

## 2021-10-03 MED ORDER — IOHEXOL 350 MG/ML SOLN
75.0000 mL | Freq: Once | INTRAVENOUS | Status: AC | PRN
  Administered 2021-10-03: 75 mL via INTRAVENOUS

## 2021-10-03 MED ORDER — HYDROCODONE-ACETAMINOPHEN 5-325 MG PO TABS: 1.0000 | ORAL_TABLET | Freq: Four times a day (QID) | ORAL | 0 refills | Status: AC | PRN

## 2021-10-03 NOTE — ED Notes (Signed)
Pt unable to ambulate, PA and RN aware.

## 2021-10-03 NOTE — Progress Notes (Incomplete)
{  Select Note:[id number]} 

## 2021-10-03 NOTE — Progress Notes (Signed)
BLE venous duplex has been completed.   Results can be found under chart review under CV PROC. 10/03/2021 6:30 PM Deondrae Mcgrail RVT, RDMS

## 2021-10-03 NOTE — ED Triage Notes (Signed)
Pt here from home for having worsening bilateral leg pain, that has caused him to be unable to walk. Pt reports significant shob with any exertion, pain in bilateral knees and increased swelling in lower extremities.

## 2021-10-03 NOTE — Discharge Instructions (Signed)
Your workup was overall reassuring in the ED today. It is recommended that you follow up with your PCP for further eval and to discuss possibility of being placed back on Lasix.   I have discharged you with a short course of pain medication to take as needed for pain however your pain is likely due to the amount of swelling on your legs. Continue using ace bandages to help with swelling and elevate your legs as much as possible.   Attached to this discharge paperwork is a 1800 number that you can call to find a PCP in the area that accepts your insurance. A social work consult has also been placed for primary care needs.   We have also placed an order for home health, PT, and OT. They should contact you in the next couple of days to schedule a time for a first evaluation.   Return to the ED for any new/worsening symptoms.

## 2021-10-03 NOTE — ED Provider Notes (Signed)
[facility]   CSN: [id number] Arrival date & time: 10/03/21  1443     History  Chief Complaint  Patient presents with   Leg Swelling   Shortness of Breath   Leg Pain    Anthony Strickland is a 76 y.o. male with PMHx asthma, HTN, HLD, RA, DVT not currently anticoagulated, CAD, and DDD who presents to the ED today with complaint of worsening dyspnea on exertion for the past several months. Pt also complains of worsening leg swelling/bilateral knee pain and lower back pain. 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 He reports that due to his pain he has a difficult time getting up from his bed. He reports that his PCP at the [facility] put him on Lyrica however does not feel like it is helping. He does not take anything specifically for pain. Denies any new falls or injuries. Pt's wife reports that his lower extremity swelling has gotten significantly worse over the past year and she feels this is contributing to his symptoms as well. She mentions that he was taken off of his Lasix about 1 year ago however is unsure why - patient reports he was taken off due to overactive bladder. Pt denies any chest pain. No lower extremity weakness. No saddle anesthesia, urinary retention, or urinary/bowel incontinence.   The history is provided by the patient, the spouse and medical records.      Home Medications Prior to Admission medications   Medication Sig Start Date End Date Taking? Authorizing Provider  HYDROcodone-acetaminophen (NORCO/VICODIN) 5-325 MG tablet Take 1 tablet by mouth every 6 (six) hours as needed. 10/03/21  Yes Renesmae Donahey, PA-C  albuterol (PROVENTIL HFA;VENTOLIN HFA) 108 (90 BASE) MCG/ACT inhaler Inhale two puffs four times daily as needed for asthma    [provider]  amoxicillin (AMOXIL) 500 MG capsule Take 1,000 mg by mouth 2 (two) times daily. For 10 days. Filled on 05-05-14     [provider]  aspirin 81 MG chewable tablet Chew 81 mg by mouth daily.    [provider]  benazepril (LOTENSIN) 40 MG tablet Take 40 mg by mouth daily. For bp    [provider]  carboxymethylcellulose (REFRESH PLUS) 0.5 % SOLN One drop both eyes four times daily    [provider]  citalopram (CELEXA) 40 MG tablet Take 40 mg by mouth daily.    [provider]  clindamycin (CLEOCIN) 300 MG capsule Take 1 capsule (300 mg total) by mouth 4 (four) times daily. X 7 days 05/10/14   Hess, Nada Boozerobyn M, PA-C  folic acid (FOLVITE) 1 MG tablet Take 1 mg by mouth daily.    [provider]  furosemide (LASIX) 20 MG tablet Take three tablets twice daily for swelling    [provider]  gabapentin (NEURONTIN) 100 MG capsule Take 200-400 mg by mouth at bedtime. Takes 200mg  in the morning and takes 400mg  in the evening    [provider]  HYDROcodone-acetaminophen (NORCO) 5-325 MG tablet Take 1-2 tablets by mouth every 6 (six) hours as needed. 04/22/16   Geoffery Lyonselo, Douglas, MD  loratadine (CLARITIN) 10 MG tablet Take 10 mg by mouth daily.    [provider]  metoprolol (LOPRESSOR) 50 MG tablet Take 50 mg by mouth 2 (two) times daily.     [provider]  naproxen (NAPROSYN) 500 MG tablet Take 1 tablet (500 mg total) by  mouth 2 (two) times daily. 04/22/16   Geoffery Lyons, MD  nitroGLYCERIN (NITROSTAT) 0.6 MG SL tablet Place 0.6 mg under the tongue every 5 (five) minutes as needed.    [provider]  nystatin (MYCOSTATIN/NYSTOP) 100000 UNIT/GM POWD Apply to groin area once a day 12/24/12   Oneal Grout, MD  pantoprazole (PROTONIX) 40 MG tablet Take 40 mg by mouth daily.    [provider]  potassium chloride (MICRO-K) 10 MEQ CR capsule Take 10 mEq by mouth daily.     [provider]  pravastatin (PRAVACHOL) 20 MG tablet Take 20 mg by mouth daily.    [provider]  prazosin (MINIPRESS) 1 MG  capsule Take 1 mg by mouth at bedtime.    [provider]  trazodone (DESYREL) 300 MG tablet Take 300 mg by mouth at bedtime.    [provider]  vardenafil (LEVITRA) 20 MG tablet Take 20 mg by mouth daily as needed for erectile dysfunction.    [provider]      Allergies    Patient has no known allergies.    Review of Systems   Review of Systems  Constitutional:  Negative for chills and fever.  Respiratory:  Positive for shortness of breath. Negative for cough.   Cardiovascular:  Positive for leg swelling. Negative for chest pain.  Genitourinary:  Negative for difficulty urinating.  Musculoskeletal:  Positive for arthralgias and back pain.  Neurological:  Negative for weakness.  All other systems reviewed and are negative.  Physical Exam Updated Vital Signs BP 136/77   Pulse 68   Temp 98.9 F (37.2 C) (Oral)   Resp (!) 21   Ht  (1.727 m)   Wt (!) 142.4 kg   SpO2 96% Comment: Pt on 2L nasal cannula  BMI 47.74 kg/m  Physical Exam Vitals and nursing note reviewed.  Constitutional:      Appearance: He is obese. He is not ill-appearing or diaphoretic.  HENT:     Head: Normocephalic and atraumatic.  Eyes:     Conjunctiva/sclera: Conjunctivae normal.  Cardiovascular:     Rate and Rhythm: Normal rate and regular rhythm.     Pulses: Normal pulses.  Pulmonary:     Effort: Pulmonary effort is normal.     Breath sounds: Decreased breath sounds present. No wheezing, rhonchi or rales.     Comments: Currently on 2L [place]. Satting 96%.  Abdominal:     Palpations: Abdomen is soft.     Tenderness: There is no abdominal tenderness.  Musculoskeletal:     Cervical back: Neck supple.     Right lower leg: Edema present.     Left lower leg: Edema present.     Comments: 2+ pitting edema bilaterally. Strength equal to BLEs. Sensation intact throughout. 2+ DP pulses bilaterally.   Skin:    General: Skin is warm and dry.  Neurological:     Mental Status:  He is alert.    ED Results / Procedures / Treatments   Labs (all labs ordered are listed, but only abnormal results are displayed) Labs Reviewed  COMPREHENSIVE METABOLIC PANEL - Abnormal; Notable for the following components:      Result Value   Glucose, Bld 110 (*)    All other components within normal limits  CBC WITH DIFFERENTIAL/PLATELET  BRAIN NATRIURETIC PEPTIDE  TROPONIN I (HIGH SENSITIVITY)  TROPONIN I (HIGH SENSITIVITY)    EKG None  Radiology DG Chest 2 View  Result Date: 10/03/2021 CLINICAL DATA:  shortness of breath EXAM: CHEST - 2 VIEW COMPARISON:  Chest x-ray 04/21/2012 FINDINGS: Interval removal of a right internal jugular central venous catheter noted in 2013. The heart and mediastinal contours are unchanged. Aortic calcification. No focal consolidation. No pulmonary edema. No pleural effusion. No pneumothorax. No acute osseous abnormality. IMPRESSION: 1. No active cardiopulmonary disease. 2.  Aortic Atherosclerosis (ICD10-I70.0). Electronically Signed   By: Tish Frederickson M.D.   On: 10/03/2021 16:34   CT Angio Chest PE W/Cm &/Or Wo Cm  Result Date: 10/03/2021 CLINICAL DATA:  Leg swelling.  Shortness of breath. EXAM: CT ANGIOGRAPHY CHEST WITH CONTRAST TECHNIQUE: Multidetector CT imaging of the chest was performed using the standard protocol during bolus administration of intravenous contrast. Multiplanar CT image reconstructions and MIPs were obtained to evaluate the vascular anatomy. RADIATION DOSE REDUCTION: This exam was performed according to the departmental dose-optimization program which includes automated exposure control, adjustment of the mA and/or kV according to patient size and/or use of iterative reconstruction technique. CONTRAST:  75mL OMNIPAQUE IOHEXOL 350 MG/ML SOLN COMPARISON:  CT angiogram chest 04/22/2016 FINDINGS: Cardiovascular: Satisfactory opacification of the pulmonary arteries to the segmental level. No evidence of pulmonary embolism. Normal heart  size. No pericardial effusion. There are atherosclerotic calcifications of the aorta. Mediastinum/Nodes: There is a mildly enlarged subcarinal lymph node measuring 11 mm short axis, decreased in size compared to the prior study. No enlarged mediastinal, hilar, or axillary lymph nodes. Thyroid gland, trachea, and esophagus demonstrate no significant findings. Lungs/Pleura: Lungs are clear. No pleural effusion or pneumothorax. Upper Abdomen: No acute abnormality. Musculoskeletal: Multilevel degenerative changes affect the spine. Review of the MIP images confirms the above findings. IMPRESSION: 1. No evidence for pulmonary embolism. No acute cardiopulmonary process. 2. Mildly enlarged subcarinal lymph node has decreased in size compared to the prior study. Aortic Atherosclerosis (ICD10-I70.0). Electronically Signed   By: Darliss Cheney M.D.   On: 10/03/2021 19:17   VAS [place] LOWER EXTREMITY VENOUS (DVT) (7a-7p)  Result Date: 10/03/2021  Lower Venous DVT Study Patient Name:  Anthony Strickland  Date of Exam:   10/03/2021 Medical Rec #: [id number]      Accession #:    [id number] Date of Birth: [birth year]      Patient Gender: M Patient Age:   76 years Exam Location:  [facility] Procedure:      VAS [place] LOWER EXTREMITY VENOUS (DVT) Referring Phys: Gabreal Worton --------------------------------------------------------------------------------  Indications: Swelling.  Risk Factors: DVT RLE (04/23/12). Limitations: Body habitus and poor ultrasound/tissue interface. Comparison Study: Previous exam on 05/10/14 was negative for DVT. Performing Technologist: Ernestene Mention RVT, RDMS  Examination Guidelines: A complete evaluation includes B-mode imaging, spectral Doppler, color Doppler, and power Doppler as needed of all accessible portions of each vessel. Bilateral testing is considered an integral part of a complete examination. Limited examinations for reoccurring indications may be performed as noted. The reflux portion of the exam is  performed with the patient in reverse Trendelenburg.  +---------+---------------+---------+-----------+----------+-------------------+ RIGHT    CompressibilityPhasicitySpontaneityPropertiesThrombus Aging      +---------+---------------+---------+-----------+----------+-------------------+ CFV      Full           Yes      Yes                                      +---------+---------------+---------+-----------+----------+-------------------+ SFJ      Full                                                             +---------+---------------+---------+-----------+----------+-------------------+  FV Prox  Full           Yes      Yes                                      +---------+---------------+---------+-----------+----------+-------------------+ FV Mid   Full           Yes      Yes                                      +---------+---------------+---------+-----------+----------+-------------------+ FV DistalFull           Yes      Yes                                      +---------+---------------+---------+-----------+----------+-------------------+ PFV      Full                                                             +---------+---------------+---------+-----------+----------+-------------------+ POP      Full           Yes      Yes                                      +---------+---------------+---------+-----------+----------+-------------------+ PTV      Full                                         Not well visualized +---------+---------------+---------+-----------+----------+-------------------+ PERO     Full                                         Not well visualized +---------+---------------+---------+-----------+----------+-------------------+   +---------+---------------+---------+-----------+----------+-------------------+ LEFT     CompressibilityPhasicitySpontaneityPropertiesThrombus Aging       +---------+---------------+---------+-----------+----------+-------------------+ CFV      Full           Yes      Yes                                      +---------+---------------+---------+-----------+----------+-------------------+ SFJ      Full                                                             +---------+---------------+---------+-----------+----------+-------------------+ FV Prox  Full           Yes      Yes                                      +---------+---------------+---------+-----------+----------+-------------------+  FV Mid   Full           Yes      Yes                                      +---------+---------------+---------+-----------+----------+-------------------+ FV DistalFull           Yes      Yes                                      +---------+---------------+---------+-----------+----------+-------------------+ PFV      Full                                                             +---------+---------------+---------+-----------+----------+-------------------+ POP      Full           Yes      Yes                                      +---------+---------------+---------+-----------+----------+-------------------+ PTV      Full                                                             +---------+---------------+---------+-----------+----------+-------------------+ PERO     Full                                         Not well visualized +---------+---------------+---------+-----------+----------+-------------------+     Summary: BILATERAL: - No evidence of deep vein thrombosis seen in the lower extremities, bilaterally. -No evidence of popliteal cyst, bilaterally.   *See table(s) above for measurements and observations.    Preliminary     Procedures Procedures    Medications Ordered in ED Medications  iohexol (OMNIPAQUE) 350 MG/ML injection 75 mL (75 mLs Intravenous Contrast Given 10/03/21 1905)    ED Course/  Medical Decision Making/ A&P                           Medical Decision Making 76 year old male who presents to the ED today with worsening complaints of dyspnea on exertion, worsening leg swelling, lower back pain, bilateral knee pain.  History of arthritis in knees as well as degenerative disc disease in lower back.  Taken off of Lasix approximately 1 year ago due to overactive bladder.  On arrival to the ED today vitals are stable.  Patient's work-up started including chest x-ray, CBC, CMP, troponin, BNP.  Chest x-ray without findings of pulmonary vascular congestion. CBC without leukocytosis and hemoglobin stable. CMP with glucose 110, history of diabetes.  No other electrolyte abnormalities.  LFTs unremarkable. Troponin of 4. BNP of 39.  Per chart review does appear patient has history of DVT and is not anticoagulated.  He is noted to  have significant bilateral lower extremity edema and given normal BNP we will proceed with DVT study for further eval.  Given dyspnea on exertion and history of DVT we will proceed with CTA for further eval as well.  On initial exam patient satting in the 90s on room air with good pleth. He did desat for a couple of seconds however pleth did not appear adequate at that time and I suspect it was a false reading. Later on during ED visit pt placed on 2L [address] by nursing staff - do not feel he requires same today. Will attempt to wean off.   Repeat troponin of 5 DVT study negative CTA negative for PE.   Workup overall reassuring. Pt has been weaned off of O2. Do feel his issues are all chronic in nature and need to be followed up with his PCP for same. Do not feel he requires additional emergent workup in the ED today or admission for same. He is stable for discharge home. Wife reports that he is unable to get up due to pain. Will discharge with pain meds. Pt will likely also benefit from HH/PT/OT. Suspect his obesity is also causing issues. Family in agreement with  plan.   Amount and/or Complexity of Data Reviewed Labs: ordered. Decision-making details documented in ED Course. Radiology: ordered. Decision-making details documented in ED Course.  Risk Prescription drug management.          Final Clinical Impression(s) / ED Diagnoses Final diagnoses:  Peripheral edema  Shortness of breath  Pain in both lower extremities    Rx / DC Orders ED Discharge Orders          Ordered    Home Health        10/03/21 1951    Face-to-face encounter (required for Medicare/Medicaid patients)       Comments: I Tanda Rockers certify that this patient is under my care and that I, or a nurse practitioner or physician's assistant working with me, had a face-to-face encounter that meets the physician face-to-face encounter requirements with this patient on 10/03/2021. The encounter with the patient was in whole, or in part for the following medical condition(s) which is the primary reason for home health care (List medical condition): leg swelling causing issues with ambulation   10/03/21 1951    HYDROcodone-acetaminophen (NORCO/VICODIN) 5-325 MG tablet  Every 6 hours PRN        10/03/21 1953             Discharge Instructions      Your workup was overall reassuring in the ED today. It is recommended that you follow up with your PCP for further eval and to discuss possibility of being placed back on Lasix.   I have discharged you with a short course of pain medication to take as needed for pain however your pain is likely due to the amount of swelling on your legs. Continue using ace bandages to help with swelling and elevate your legs as much as possible.   Attached to this discharge paperwork is a 1800 number that you can call to find a PCP in the area that accepts your insurance. A social work consult has also been placed for primary care needs.   We have also placed an order for home health, PT, and OT. They should contact you in the next couple  of days to schedule a time for a first evaluation.   Return to the ED for any new/worsening symptoms.  Tanda Rockers, PA-C 10/03/21 1956    Ernie Avena, MD 10/04/21 [phone]

## 2021-10-04 DIAGNOSIS — I10 Essential (primary) hypertension: Secondary | ICD-10-CM | POA: Diagnosis not present

## 2021-10-04 DIAGNOSIS — E785 Hyperlipidemia, unspecified: Secondary | ICD-10-CM | POA: Diagnosis not present

## 2021-10-04 DIAGNOSIS — E119 Type 2 diabetes mellitus without complications: Secondary | ICD-10-CM | POA: Diagnosis not present

## 2021-10-04 DIAGNOSIS — F32A Depression, unspecified: Secondary | ICD-10-CM | POA: Diagnosis not present

## 2021-10-26 DIAGNOSIS — E119 Type 2 diabetes mellitus without complications: Secondary | ICD-10-CM | POA: Diagnosis not present

## 2021-10-26 DIAGNOSIS — I509 Heart failure, unspecified: Secondary | ICD-10-CM | POA: Diagnosis not present

## 2021-10-26 DIAGNOSIS — F32A Depression, unspecified: Secondary | ICD-10-CM | POA: Diagnosis not present

## 2021-10-26 DIAGNOSIS — I10 Essential (primary) hypertension: Secondary | ICD-10-CM | POA: Diagnosis not present

## 2021-11-25 DIAGNOSIS — I10 Essential (primary) hypertension: Secondary | ICD-10-CM | POA: Diagnosis not present

## 2021-11-25 DIAGNOSIS — E119 Type 2 diabetes mellitus without complications: Secondary | ICD-10-CM | POA: Diagnosis not present

## 2021-11-25 DIAGNOSIS — I509 Heart failure, unspecified: Secondary | ICD-10-CM | POA: Diagnosis not present

## 2021-11-25 DIAGNOSIS — K219 Gastro-esophageal reflux disease without esophagitis: Secondary | ICD-10-CM | POA: Diagnosis not present

## 2021-12-28 ENCOUNTER — Other Ambulatory Visit: Payer: Self-pay

## 2021-12-28 NOTE — Patient Outreach (Signed)
[facility]) Care Management  12/28/2021  Harvir Patry [birth year] [id number]   Telephone Screen       RN CM received return call and voicemail from patient. Return call to patient and no answer.      Plan: RN CM will make outreach attempt to patient within 4 business days.   Antionette Fairy, RN,BSN,CCM [facility] Care Management Telephonic Care Management Coordinator Direct Phone: [phone] Toll Free: [phone] Fax: [phone]

## 2021-12-28 NOTE — Patient Outreach (Signed)
[facility]) Care Management  12/28/2021  Anthony Strickland Feb 09, 1946 [id number]   Telephone Screen    Outreach call to patient to introduce [facility] services and assess care needs as part of benefit of PCP office and insurance plan. No answer. RN CM left HIPAA compliant voicemail message along with contact info.   Plan: RN CM will make outreach attempt to patient within 4 business days.  Antionette Fairy, RN,BSN,CCM [facility] Care Management Telephonic Care Management Coordinator Direct Phone: [phone] Toll Free: [phone] Fax: [phone]

## 2022-01-01 ENCOUNTER — Other Ambulatory Visit: Payer: Self-pay

## 2022-01-01 NOTE — Patient Outreach (Signed)
[facility]) Care Management  01/01/2022  Anthony Strickland [birth year] [id number]   Telephone Screen       Outreach call to patient to introduce [facility] services and assess care needs as part of benefit of PCP office and insurance plan.Spoke with patient. He reports he is doing fairly well. Patient states that [facility] including [facility]. He has [facility] as well but does not use services/benefits. Reviewed benefit plans provided through [facility] including transportation, pharmacy and OTC benefit. He denies any RN CM needs or concerns.   Main healthcare issue/concern today: Patient states that he is in the process of getting approval for wgt loss surgery he was told he would need to have this done first then knee surgery for arthritis. He has been taking Ozempic and has lost about 35 lbs since starting med in May. Blood sugars are better controlled as well.   Health Maintenance/Care Gaps: -Last AWV: None on file-encouraged to make an appt     Antionette Fairy, RN,BSN,CCM [facility] Care Management Telephonic Care Management Coordinator Direct Phone: [phone] Toll Free: [phone] Fax: [phone]

## 2022-11-09 IMAGING — DX DG CHEST 2V
2 series · 2 of 2 positions shown · non-contrast
Comparison: Chest x-ray 04/21/2012

CLINICAL DATA: shortness of breath

EXAM:
CHEST - 2 VIEW

[chest lat]
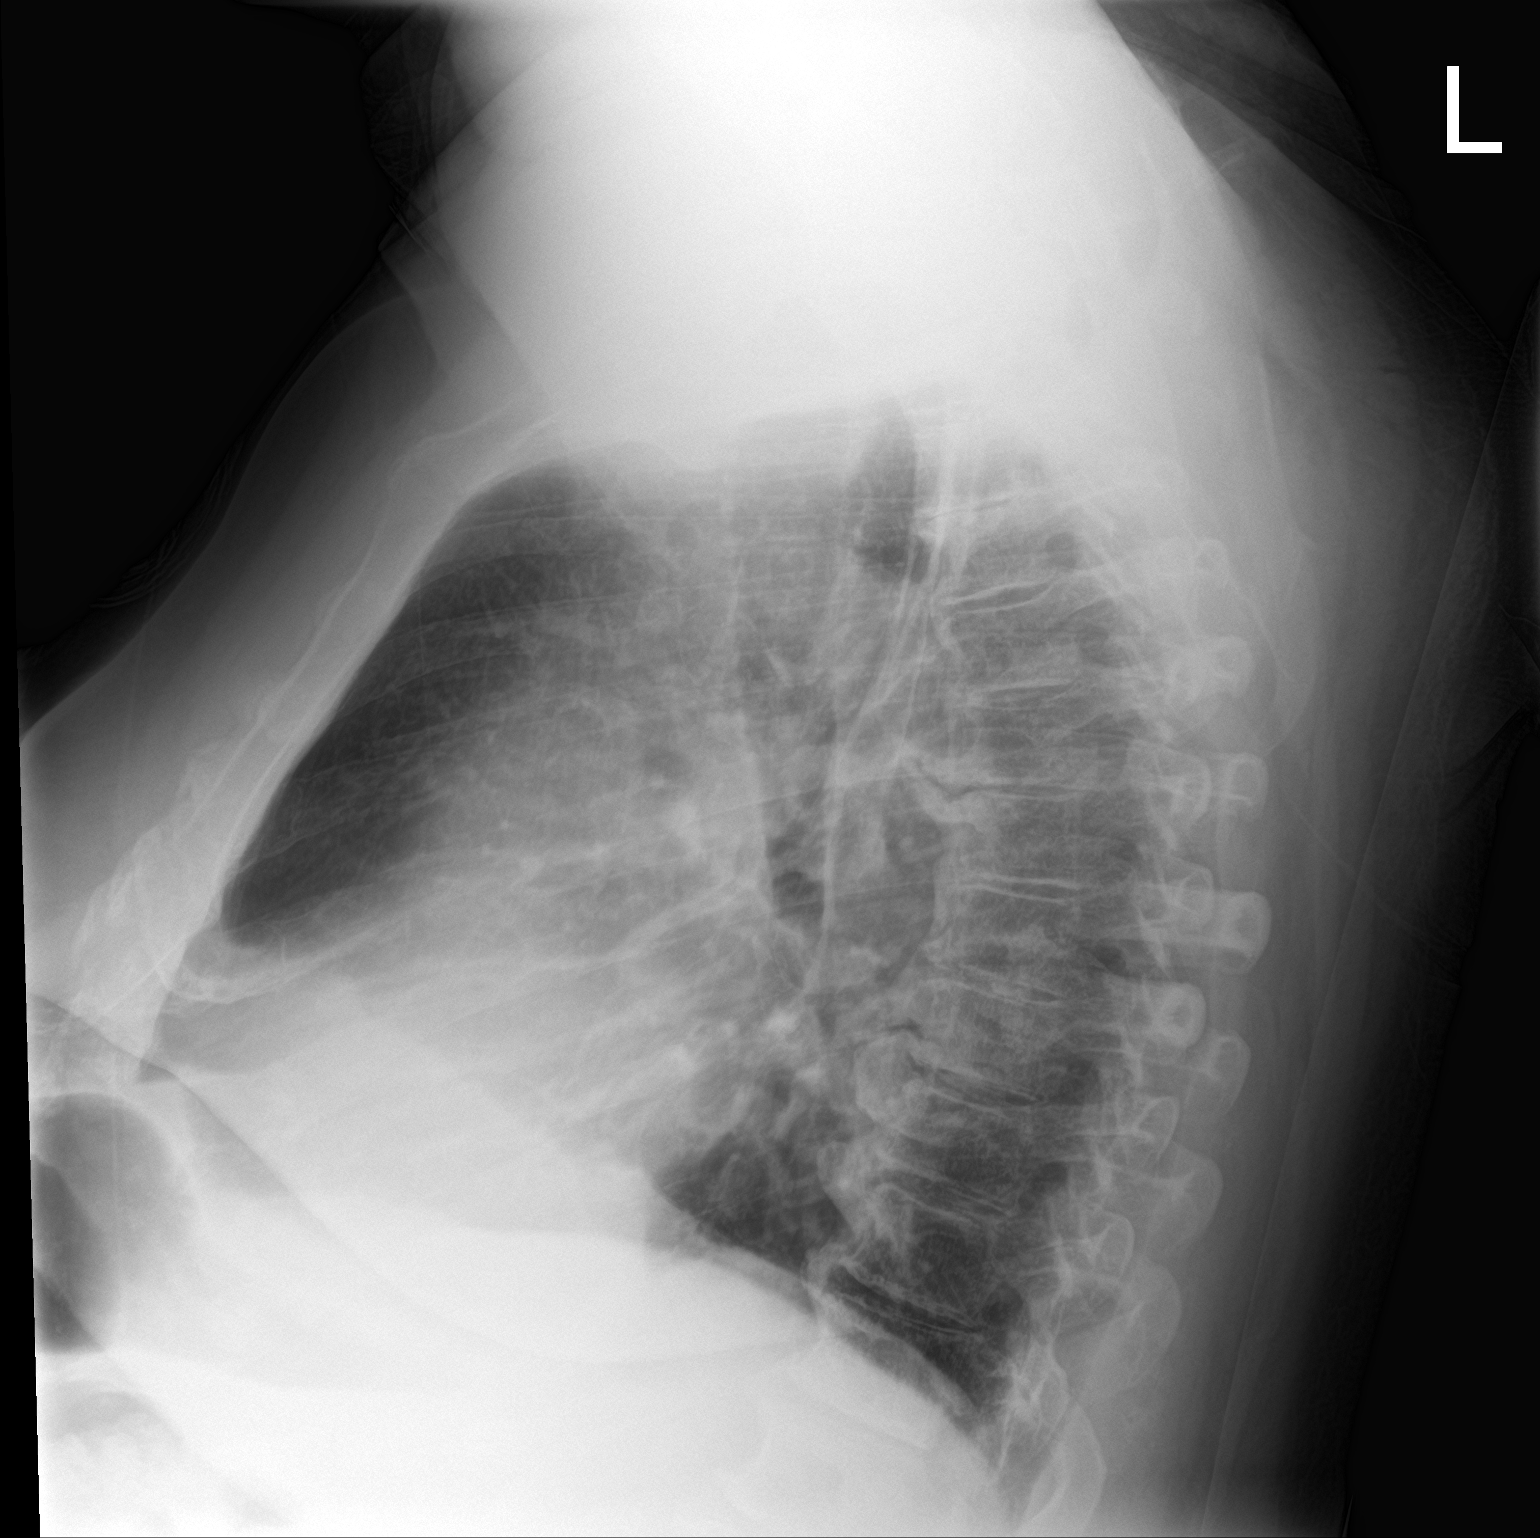

[chest ap]
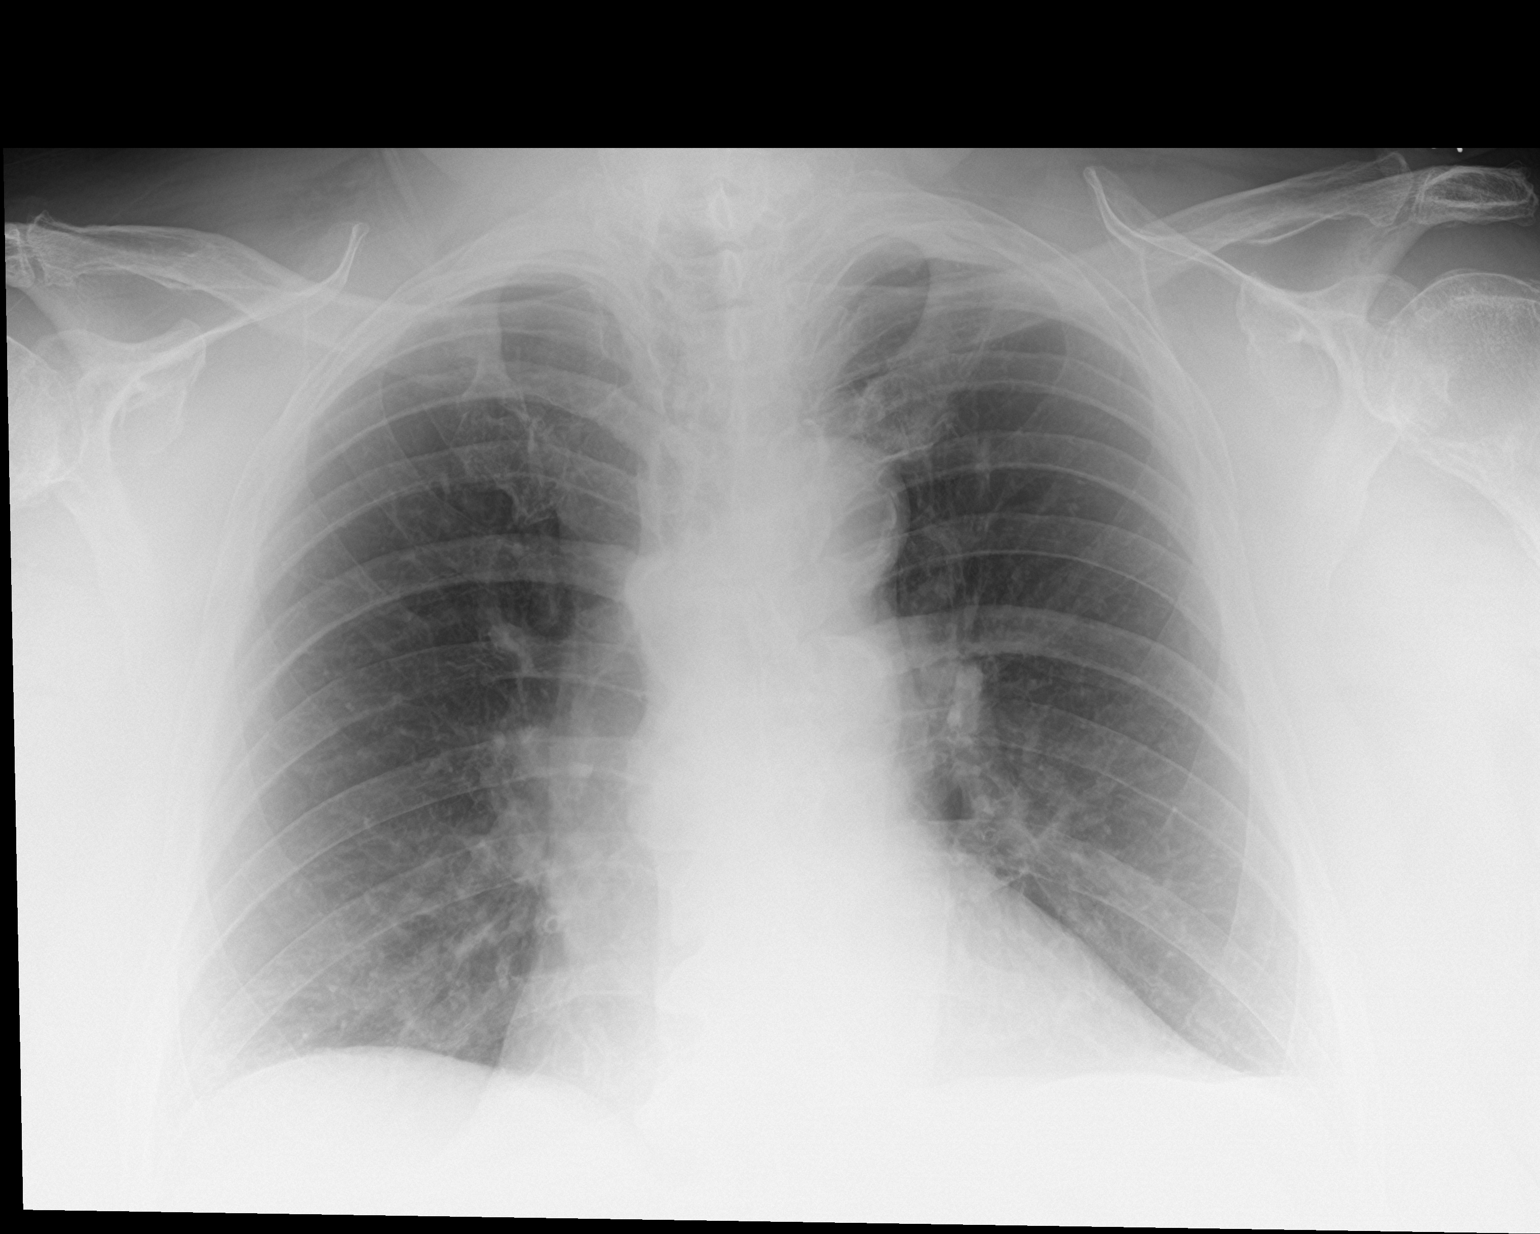

[2 of 2 positions shown; findings below may reference images not displayed]

FINDINGS: Interval removal of a right internal jugular central venous catheter
noted in [id number].

The heart and mediastinal contours are unchanged. Aortic
calcification.

No focal consolidation. No pulmonary edema. No pleural effusion. No
pneumothorax.

No acute osseous abnormality.
IMPRESSION: 1. No active cardiopulmonary disease.
2.  Aortic Atherosclerosis ([id number]).

## 2022-11-09 IMAGING — CT CT ANGIO CHEST
2 of 6 series · 18 of 46 positions shown · IV contrast (agent unspecified)
Comparison: CT angiogram chest 04/22/2016

CLINICAL DATA: Leg swelling.  Shortness of breath.

EXAM:
CT ANGIOGRAPHY CHEST WITH CONTRAST
TECHNIQUE: Multidetector CT imaging of the chest was performed using the
standard protocol during bolus administration of intravenous
contrast. Multiplanar CT image reconstructions and MIPs were
obtained to evaluate the vascular anatomy.

[Series 6: thins (person_name) · axial · 0.95mm/px · z∈[+1196,+1485]mm · 15 of 317 slices shown]
[im 14/317  lung]
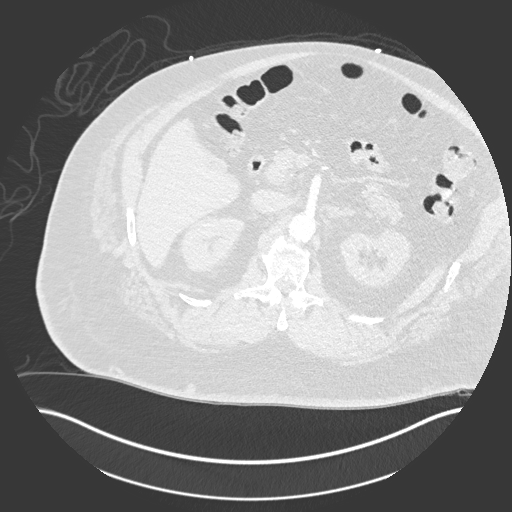
[im 42/317  soft-tissue]
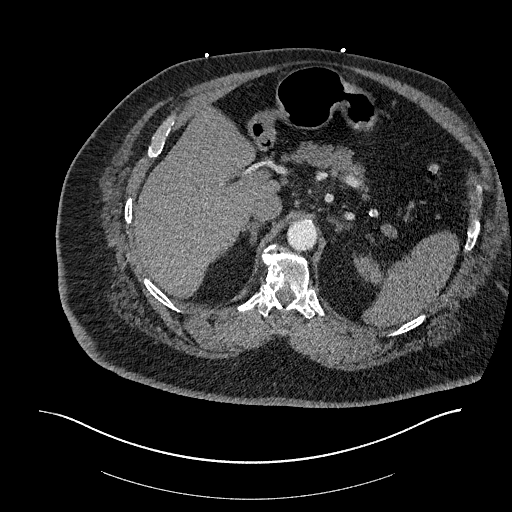
[im 55/317  lung]
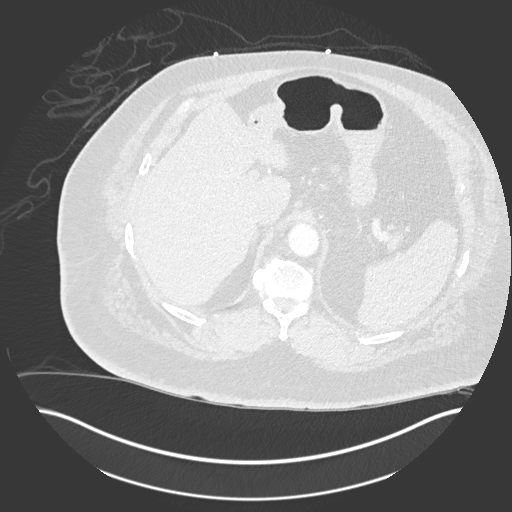
[im 83/317  soft-tissue]
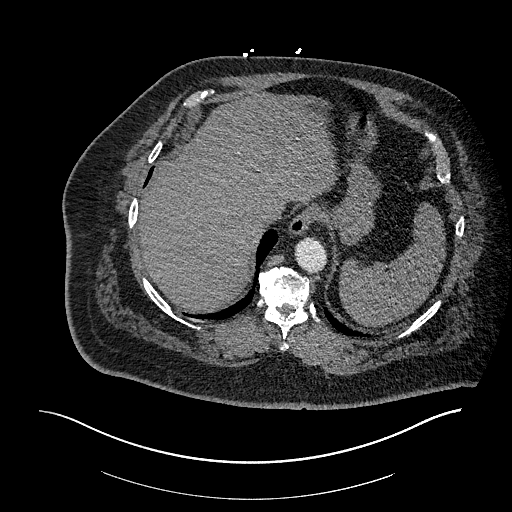
[im 97/317  lung]
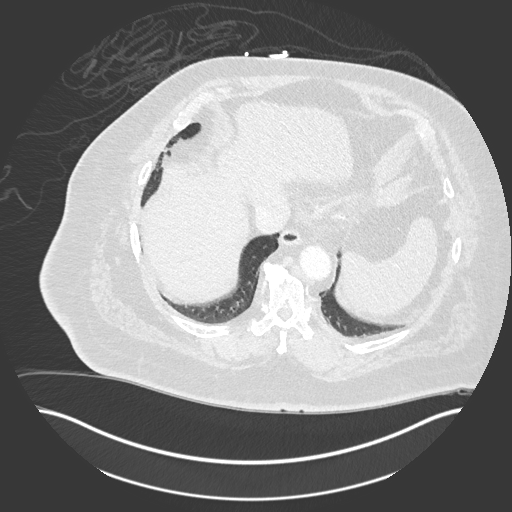
[im 124/317  soft-tissue]
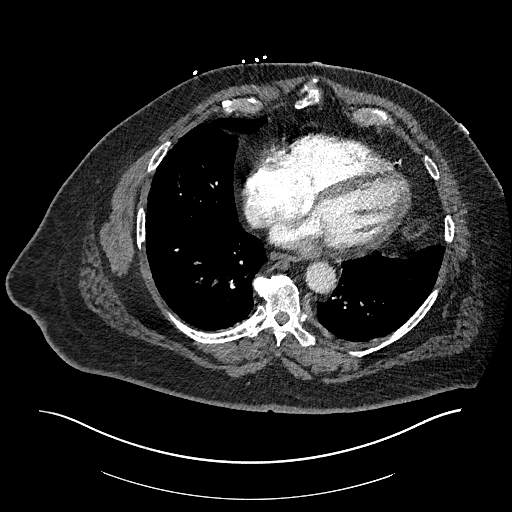
[im 138/317  lung]
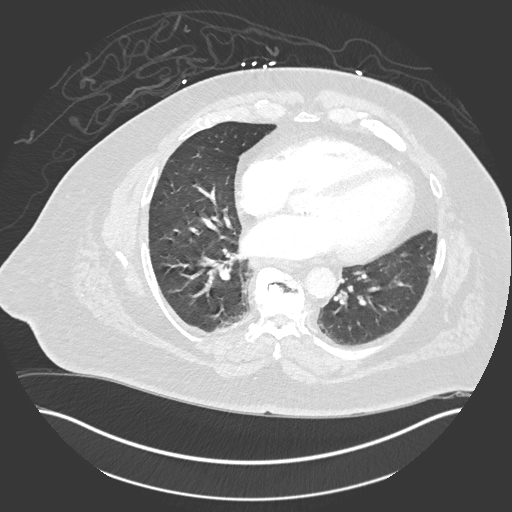
[im 165/317  soft-tissue]
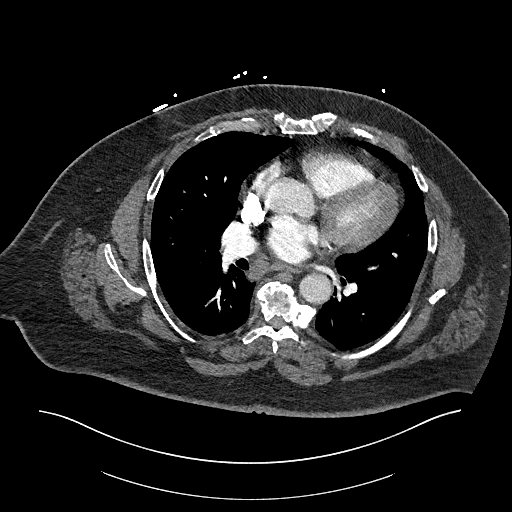
[im 179/317  lung]
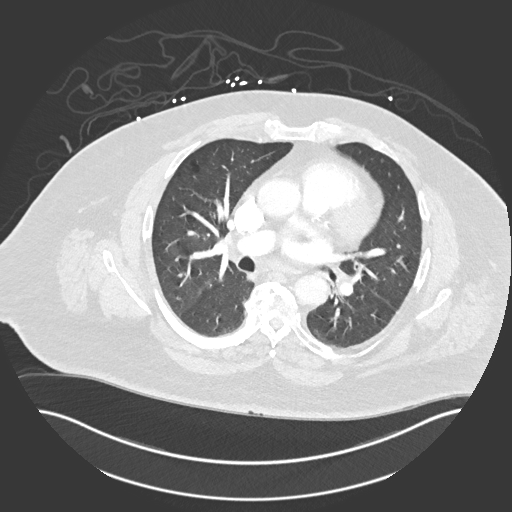
[im 193/317  soft-tissue]
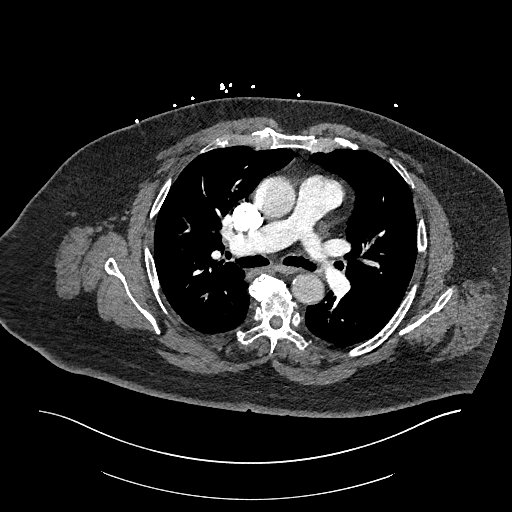
[im 220/317  lung]
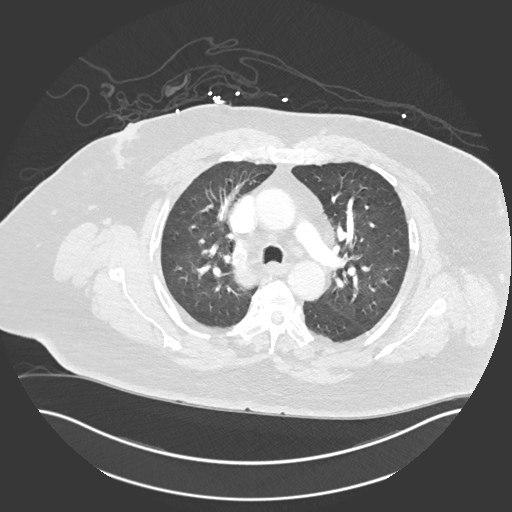
[im 234/317  soft-tissue]
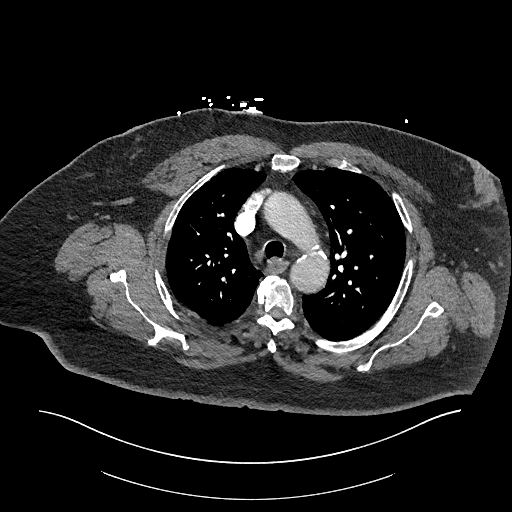
[im 262/317  lung]
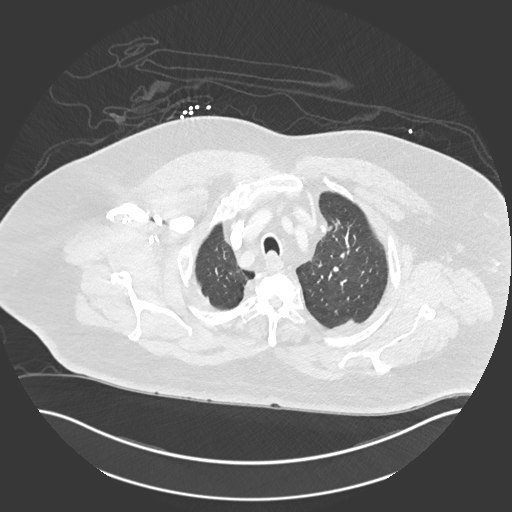
[im 275/317  soft-tissue]
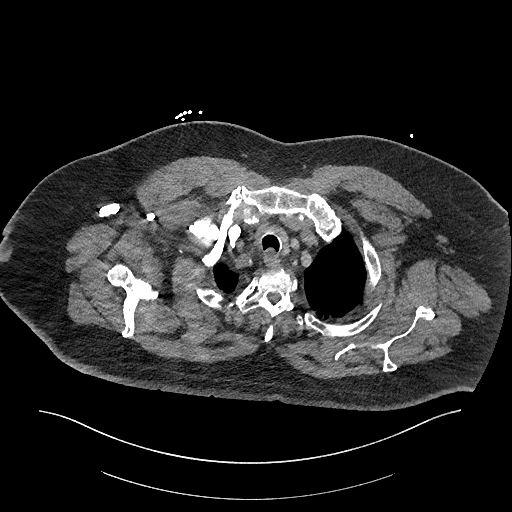
[im 303/317  lung]
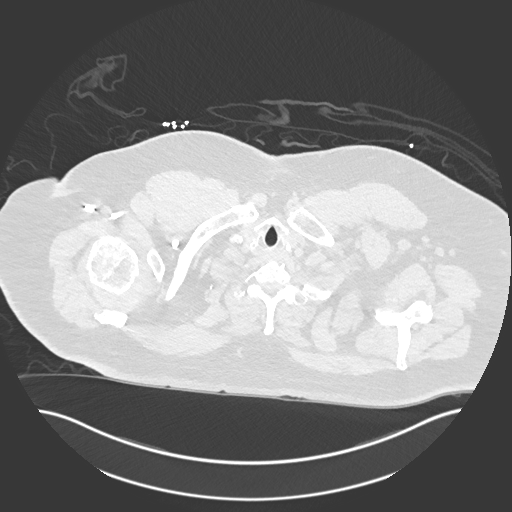

[Series 8: coronal mpr · coronal · 0.63mm/px · 3 of 187 slices shown]
[im 47/187  soft-tissue]
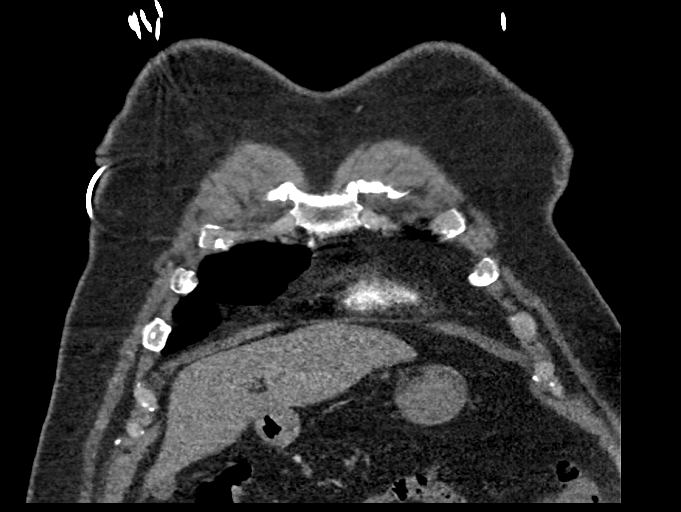
[im 94/187  soft-tissue]
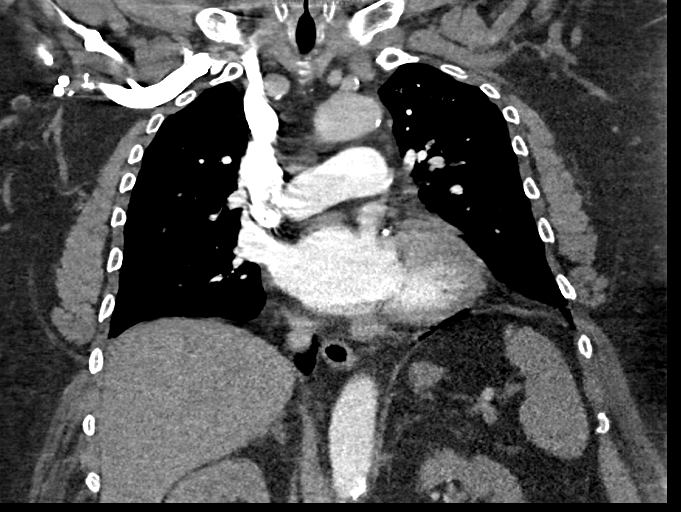
[im 140/187  soft-tissue]
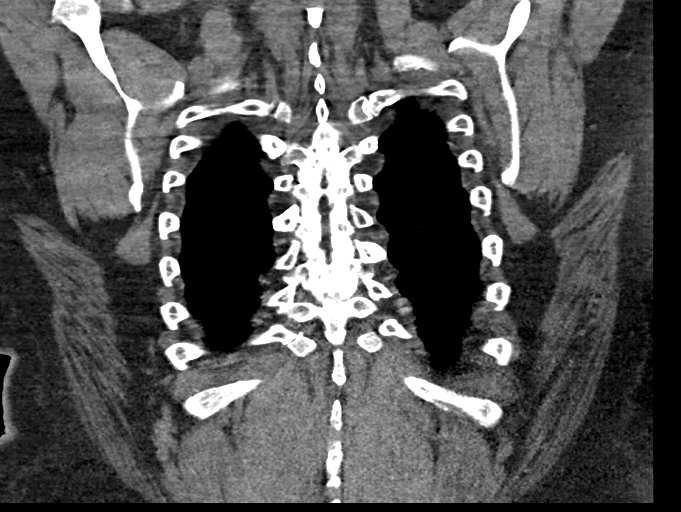

[18 of 46 positions shown; findings below may reference images not displayed]

RADIATION DOSE REDUCTION: This exam was performed according to the
departmental dose-optimization program which includes automated
exposure control, adjustment of the mA and/or kV according to
patient size and/or use of iterative reconstruction technique.

CONTRAST:  75mL OMNIPAQUE IOHEXOL 350 MG/ML SOLN
FINDINGS: Cardiovascular: Satisfactory opacification of the pulmonary arteries
to the segmental level. No evidence of pulmonary embolism. Normal
heart size. No pericardial effusion. There are atherosclerotic
calcifications of the aorta.

Mediastinum/Nodes: There is a mildly enlarged subcarinal lymph node
measuring 11 mm short axis, decreased in size compared to the prior
study. No enlarged mediastinal, hilar, or axillary lymph nodes.
Thyroid gland, trachea, and esophagus demonstrate no significant
findings.

Lungs/Pleura: Lungs are clear. No pleural effusion or pneumothorax.

Upper Abdomen: No acute abnormality.

Musculoskeletal: Multilevel degenerative changes affect the spine.

Review of the MIP images confirms the above findings.
IMPRESSION: 1. No evidence for pulmonary embolism. No acute cardiopulmonary
process.
2. Mildly enlarged subcarinal lymph node has decreased in size
compared to the prior study.

Aortic Atherosclerosis (J08DB-CB0.0).
